# Patient Record
Sex: Female | Born: 1965
Health system: Southern US, Community
[De-identification: ages and names within clinical notes are randomized; demographics above are authoritative.]

## PROBLEM LIST (undated history)

## (undated) DIAGNOSIS — N943 Premenstrual tension syndrome: Secondary | ICD-10-CM

## (undated) DIAGNOSIS — K219 Gastro-esophageal reflux disease without esophagitis: Secondary | ICD-10-CM

## (undated) HISTORY — DX: Premenstrual tension syndrome: N94.3

## (undated) HISTORY — PX: TONSILLECTOMY AND ADENOIDECTOMY: SHX28

## (undated) HISTORY — DX: Gastro-esophageal reflux disease without esophagitis: K21.9

---

## 1998-03-24 ENCOUNTER — Other Ambulatory Visit: Admission: RE | Admit: 1998-03-24 | Discharge: 1998-03-24 | Payer: Self-pay | Admitting: Obstetrics and Gynecology

## 1998-12-16 ENCOUNTER — Other Ambulatory Visit: Admission: RE | Admit: 1998-12-16 | Discharge: 1998-12-16 | Payer: Self-pay | Admitting: Gynecology

## 1999-01-06 ENCOUNTER — Encounter: Admission: RE | Admit: 1999-01-06 | Discharge: 1999-04-06 | Payer: Self-pay | Admitting: Gynecology

## 1999-06-23 ENCOUNTER — Inpatient Hospital Stay (HOSPITAL_COMMUNITY): Admission: AD | Admit: 1999-06-23 | Discharge: 1999-06-25 | Payer: Self-pay | Admitting: Gynecology

## 1999-08-04 ENCOUNTER — Other Ambulatory Visit: Admission: RE | Admit: 1999-08-04 | Discharge: 1999-08-04 | Payer: Self-pay | Admitting: Gynecology

## 2000-08-28 ENCOUNTER — Other Ambulatory Visit: Admission: RE | Admit: 2000-08-28 | Discharge: 2000-08-28 | Payer: Self-pay | Admitting: Gynecology

## 2001-05-16 ENCOUNTER — Encounter: Payer: Self-pay | Admitting: Family Medicine

## 2001-05-16 ENCOUNTER — Ambulatory Visit (HOSPITAL_COMMUNITY): Admission: RE | Admit: 2001-05-16 | Discharge: 2001-05-16 | Payer: Self-pay | Admitting: Family Medicine

## 2001-09-12 ENCOUNTER — Other Ambulatory Visit: Admission: RE | Admit: 2001-09-12 | Discharge: 2001-09-12 | Payer: Self-pay | Admitting: Gynecology

## 2002-09-04 HISTORY — PX: GASTROSTOMY TUBE PLACEMENT: SHX655

## 2002-09-12 ENCOUNTER — Inpatient Hospital Stay (HOSPITAL_COMMUNITY): Admission: EM | Admit: 2002-09-12 | Discharge: 2002-09-14 | Payer: Self-pay | Admitting: Emergency Medicine

## 2002-09-12 ENCOUNTER — Encounter (INDEPENDENT_AMBULATORY_CARE_PROVIDER_SITE_OTHER): Payer: Self-pay | Admitting: Specialist

## 2002-09-12 ENCOUNTER — Encounter: Payer: Self-pay | Admitting: Emergency Medicine

## 2002-09-12 ENCOUNTER — Encounter: Payer: Self-pay | Admitting: General Surgery

## 2002-09-13 ENCOUNTER — Encounter (INDEPENDENT_AMBULATORY_CARE_PROVIDER_SITE_OTHER): Payer: Self-pay | Admitting: Specialist

## 2002-09-13 ENCOUNTER — Encounter: Payer: Self-pay | Admitting: General Surgery

## 2002-09-25 ENCOUNTER — Other Ambulatory Visit: Admission: RE | Admit: 2002-09-25 | Discharge: 2002-09-25 | Payer: Self-pay | Admitting: Gynecology

## 2003-09-26 ENCOUNTER — Other Ambulatory Visit: Admission: RE | Admit: 2003-09-26 | Discharge: 2003-09-26 | Payer: Self-pay | Admitting: Gynecology

## 2004-12-13 ENCOUNTER — Other Ambulatory Visit: Admission: RE | Admit: 2004-12-13 | Discharge: 2004-12-13 | Payer: Self-pay | Admitting: Gynecology

## 2006-01-09 ENCOUNTER — Other Ambulatory Visit: Admission: RE | Admit: 2006-01-09 | Discharge: 2006-01-09 | Payer: Self-pay | Admitting: Gynecology

## 2007-02-16 ENCOUNTER — Other Ambulatory Visit: Admission: RE | Admit: 2007-02-16 | Discharge: 2007-02-16 | Payer: Self-pay | Admitting: Gynecology

## 2008-03-20 ENCOUNTER — Encounter: Payer: Self-pay | Admitting: Women's Health

## 2008-03-20 ENCOUNTER — Other Ambulatory Visit: Admission: RE | Admit: 2008-03-20 | Discharge: 2008-03-20 | Payer: Self-pay | Admitting: Gynecology

## 2008-03-20 ENCOUNTER — Ambulatory Visit: Payer: Self-pay | Admitting: Women's Health

## 2009-04-01 ENCOUNTER — Ambulatory Visit: Payer: Self-pay | Admitting: Women's Health

## 2009-04-01 ENCOUNTER — Other Ambulatory Visit: Admission: RE | Admit: 2009-04-01 | Discharge: 2009-04-01 | Payer: Self-pay | Admitting: Gynecology

## 2010-05-21 NOTE — Op Note (Signed)
NAME:  Rebekah Aguilar, Rebekah Aguilar                         ACCOUNT NO.:  [id number]   MEDICAL RECORD NO.:  [id number]                   PATIENT TYPE:  INP   LOCATION:  5727                                 FACILITY:  [facility]   PHYSICIAN:  Gabrielle Dare. Janee Morn, M.D.             DATE OF BIRTH:  [birth year]   DATE OF PROCEDURE:  09/12/2002  DATE OF DISCHARGE:                                 OPERATIVE REPORT   PREOPERATIVE DIAGNOSIS:  Choledocholithiasis.   POSTOPERATIVE DIAGNOSIS:  Choledocholithiasis.   OPERATION PERFORMED:  Laparoscopic cholecystectomy with intraoperative  cholangiogram.   SURGEON:  Gabrielle Dare. Janee Morn, M.D.   ASSISTANT:  Magnus Ivan, RN   ANESTHESIA:  General.   INDICATIONS FOR PROCEDURE:  The patient is a 45 year old white female who  was admitted with acute onset of epigastric and right upper quadrant pain,  starting last night.  Evaluation in the emergency department demonstrated  elevated liver function tests and a borderline dilated common bile duct.  In  light of the fact her liver function tests were not extremely high and her  amylase and lipase were normal, she is brought to the operating room for  cholecystectomy with intraoperative cholangiogram with plans for  postoperative ERCP if we find a stone on her cholangiogram.   DESCRIPTION OF PROCEDURE:  Informed consent was obtained.  The patient  received intravenous antibiotics.  She was taken to the operating room.  General anesthesia was administered.  Her abdomen was prepped and draped in  sterile fashion.  A curvilinear infraumbilical incision was made.  Subcutaneous tissues were dissected down revealing the anterior fascia which  was divided sharply.  The peritoneal cavity was then entered under direct  vision without difficulty.  A pursestring 0 Vicryl suture was placed around  the fascia and the Hasson trocar was inserted into the abdomen. The abdomen  was insufflated with carbon dioxide in the standard  fashion.  Subsequently  under direct vision, an 11 mm epigastric port and two 5 mm lateral ports  were placed.  Once this was done, the dome of the gallbladder was retracted  superomedially.  The infundibulum was retracted inferolaterally.  Dissection  was begun laterally and progressed medially revealing the cystic duct and  cystic artery.  A large window was made around the cystic duct between the  infundibulum and the cystic duct and liver.  Once this was accomplished, we  had excellent visualization and a clip was placed on the infundibular cystic  duct junction.  A small nick was made in the cystic duct.  Some dark bile  came out and the cystic duct was milked back.  No stones came out.  A  Reddick cholangiogram catheter was inserted into the cystic duct and an  intraoperative cholangiogram was shot.  This demonstrated that there was a  filling defect in the common bile duct close to the ampulla with no emptying  of contrast into the  duodenum. There was a decent sized stump of cystic duct  remaining and the common bile duct was minimally dilated.  The cholangiogram  was completed.  The cholangiogram catheter was removed.  Three clips were  placed proximally on the cystic duct stump and then it was divided.  Subsequently, the cystic artery was dissected circumferentially.  Two clips  were placed on it proximally, one distally and it was divided as well.  The  gallbladder was then removed from the liver bed with the Bovie cautery.  Several areas on the liver were cauterized to get excellent hemostasis.  The  gallbladder was placed into an EndoCatch bag and removed from the abdomen  via the umbilical port site.  Once this was accomplished, the abdomen was  copiously irrigated.  The liver bed was rechecked for hemostasis and two  small areas were Bovied.  Good hemostasis was obtained.  The abdomen was  copiously irrigated again.  The irrigant returned clear.  The liver bed was  rechecked  and clips were noted to be in good position and there was no  bleeding.  Subsequently, the ports were removed under direct vision.  The  pneumoperitoneum was released.  The Hasson trocar was removed from the  umbilical port site.  The umbilical fascia was closed by tying the 0 Vicryl  pursestring suture with care not to entrap any intra-abdominal contents.  All four wounds were copiously irrigated.  0.25% Marcaine with epinephrine  had been used for local anesthetic at each port site and they were each  closed with a running 4-0 Vicryl subcuticular stitch.  Sponge, needle and  instrument counts were correct.  Benzoin and Steri-Strips, and sterile  dressings were applied.  The patient tolerated the procedure well without  complication and was taken to the recovery room in stable condition.  We  plan to consult GI in order to obtain a postoperative ERCP.                                                 Gabrielle Dare Janee Morn, M.D.    BET/MEDQ  D:  09/12/2002  T:  09/13/2002  Job:  [id number]

## 2010-05-21 NOTE — H&P (Signed)
NAME:  Rebekah Aguilar, Rebekah Aguilar                         ACCOUNT NO.:  [id number]   MEDICAL RECORD NO.:  [id number]                   PATIENT TYPE:  INP   LOCATION:  5727                                 FACILITY:  [facility]   PHYSICIAN:  Gabrielle Dare. Janee Morn, M.D.             DATE OF BIRTH:  [birth year]   DATE OF ADMISSION:  09/12/2002  DATE OF DISCHARGE:                                HISTORY & PHYSICAL   CHIEF COMPLAINT:  Epigastric abdominal pain.   HISTORY OF PRESENT ILLNESS:  The patient is a 45 year old white female who  has had some intermittent epigastric pain over the past several weeks. She  developed it again acutely at 11 p.m. last night. There was some associated  nausea but no vomiting. Pain was persistent through to this morning, and she  came to the emergency department after doing some research on the web after  which she was concerned about her gallbladder. Currently, the patient has a  little bit less pain. Workup in the emergency room included an ultrasound  showing gallstones and laboratory studies showing elevated LFTs. The patient  has no other complaints.   PAST MEDICAL HISTORY:  Seasonal allergies and reflux disease.   PAST SURGICAL HISTORY:  Foot surgery and tonsillectomy.   SOCIAL HISTORY:  She does not smoke or drink alcohol.   MEDICATIONS:  1. Prilosec one p.o. q.d.  2. Claritin one p.o. q.d.   ALLERGIES:  No known drug allergies.   FAMILY HISTORY:  Her mother has gallbladder disease and had a  cholecystectomy.   REVIEW OF SYSTEMS:  GENERAL:  Negative. CARDIOVASCULAR:  Negative.  PULMONARY:  Negative. GASTROINTESTINAL:  Please see the history of present  illness. No change in bowel movements. MUSCULOSKELETAL:  Negative except for  previous foot surgery. The remainder of the review of systems is negative.   PHYSICAL EXAMINATION:  VITAL SIGNS:  Blood pressure is 112/56, respirations  18, heart rate 83, temperature 97.4, saturations 98% on room air.  GENERAL:   The patient is awake, alert, and well appearing.  HEENT:  Pupils are equal and reactive. Sclerae have no significant icterus.  NECK:  Supple.  LUNGS:  Clear to auscultation bilaterally.  HEART:  Regular rate and rhythm. Radial pulses 2+ and equal bilaterally.  ABDOMEN:  Soft with some minimal tenderness to deep palpation in her medial  right upper quadrant and epigastrium. No other tenderness or masses are  palpated.  SKIN:  Warm and dry. No rashes.   LABORATORY DATA:  Amylase 34, lipase 26. Sodium 139, potassium 3.8, chloride  105, CO2 28, BUN 10, creatinine 0.8, glucose 126. AST 354, ALT 274, alkaline  phosphatase 121, bilirubin 1.8. White blood cell count 8.1, hemoglobin 13.6,  platelets 401. Abdominal ultrasound shows cholelithiasis with sludge in  addition to common bile duct of 7 mm which is the upper limit of normal.   IMPRESSION:  Choledocholithiasis. The patient has likely passed  a stone.   PLAN:  In light of the patient's normal amylase and lipase and only mildly  elevated LFTs, we will proceed with cholecystectomy today to prevent further  stones going into her common bile duct. We will do a cholangiogram at the  same time, and if she still has a stone present in her common bile duct, we  will have her evaluated by GI for ERCP. The procedure, which is laparoscopic-  possible open-cholecystectomy with cholangiogram, was discussed in detail  with the patient and her husband. Any questions were answered, and she is  agreeable with proceeding. We will keep her NPO and give her antibiotics in  preparation for going to the operating room.                                                Gabrielle Dare Janee Morn, M.D.    BET/MEDQ  D:  09/12/2002  T:  09/13/2002  Job:  [id number]

## 2010-05-21 NOTE — Discharge Summary (Signed)
   NAME:  Rebekah Aguilar, Rebekah Aguilar                         ACCOUNT NO.:  [id number]   MEDICAL RECORD NO.:  [id number]                   PATIENT TYPE:  INP   LOCATION:  5735                                 FACILITY:  [facility]   PHYSICIAN:  Gabrielle Dare. Janee Morn, M.D.             DATE OF BIRTH:  October 22, 1965   DATE OF ADMISSION:  09/12/2002  DATE OF DISCHARGE:  09/14/2002                                 DISCHARGE SUMMARY   DISCHARGE DIAGNOSES:  1. Choledocholithiasis.  2. Status post laparoscopic cholecystectomy with intraoperative     cholangiogram.  3. Status post endoscopic retrograde cholangiopancreatography with     sphincterotomy and stone removal.   HISTORY OF PRESENT ILLNESS:  The patient is a 45 year old white female with  recent attacks of epigastric pain who developed epigastric and right upper  quadrant pain the night prior to admission.  The pain was persistent.  She  came and was evaluated at the emergency department.  Workup revealed  gallstones in her gallbladder, common bile duct of 7 mm, and mildly elevated  LFT's.  Amylase and lipase were within normal limits.  I admitted the  patient with the diagnosis of choledocholithiasis.   HOSPITAL COURSE:  The patient was taken to the operating room for  cholecystectomy with cholangiogram.  She underwent laparoscopic  cholecystectomy, and on the intraoperative cholangiogram she was noted to  have a stone in her distal common bile duct.  She tolerated the procedure  well.  Postoperatively, Dr. Claudette Head from gastroenterology was asked to  consult on the patient.  He proceeded with an ERCP with sphincterotomy and  stone removal.  The patient tolerated that procedure well, and continued to  remain afebrile and hemodynamically stable, and the following day she was  discharged home.   DISCHARGE DIET:  Low fat.   DISCHARGE ACTIVITY:  No lifting.   DISCHARGE MEDICATIONS:  Percocet 5/325 one to two p.o. q.6h. p.r.n. pain.   FOLLOWUP:  With  myself in three weeks.                                                Gabrielle Dare Janee Morn, M.D.    BET/MEDQ  D:  09/27/2002  T:  09/29/2002  Job:  [id number]   cc:   Venita Lick. Russella Dar, M.D. [facility]

## 2010-05-21 NOTE — Discharge Summary (Signed)
[facility]  Patient:    ARYEL, EDELEN                      MRN: [id number] Adm. Date:  [id number] Disc. Date: [id number] Attending:  Tonye Royalty Dictator:   Antony Contras, RNC, [facility]                           Discharge Summary  DISCHARGE DIAGNOSES:          1. Intrauterine pregnancy at 40-3/7 weeks.                               2. Spontaneous rupture of membranes.                               3. Active labor.  PROCEDURE:                    Normal spontaneous vaginal delivery with aid of Tendertouch vacuum of viable infant over a midline episiotomy with no extensions.  HISTORY OF PRESENT ILLNESS:   The patient is a 45 year old, gravida 4, para 1-0-2-1, with LMP September 14, 1998, [facility] June 20, 1999.  Prenatal risk factors  included advanced maternal age with a normal amniocentesis.  PRENATAL LABORATORY DATA:     Blood type O positive, antibody screen negative.  RPR, HBSAG, and HIV nonreactive.  MSAFP within normal limits.  GBS negative.  HOSPITAL COURSE:              The patient presented on June 23, 1999, with spontaneous rupture of membranes, clear fluid, and was also at complete dilatation. Second stage progressed without difficulty and she was delivered per normal spontaneous vaginal delivery with aid of Tendertouch vacuum secondary to deep variable decellerations of a viable female infant Apgars 9 and 9, weight 8 pounds 5 ounces.  Cord pH was 7.30 over a midline episiotomy with no extensions. Postpartum course was uncomplicated.  Postpartum CBC; hematocrit 31.5, hemoglobin 10.8, WBC 14.4, and platelets 286.  She was able to be discharged on her second postpartum day.  She did receive some breastfeeding assistance prior to discharge.  DISPOSITION:                  Follow up in six weeks.  Continue prenatal vitamins and iron.  Motrin and Tylox for pain. DD:  07/16/99 TD:  07/16/99 Job: [id number]

## 2010-06-17 ENCOUNTER — Other Ambulatory Visit (HOSPITAL_COMMUNITY)
Admission: RE | Admit: 2010-06-17 | Discharge: 2010-06-17 | Disposition: A | Discharge status: Home / Self Care | Payer: 59 | Source: Ambulatory Visit | Attending: Gynecology | Admitting: Gynecology

## 2010-06-17 ENCOUNTER — Other Ambulatory Visit: Payer: Self-pay | Admitting: Women's Health

## 2010-06-17 ENCOUNTER — Encounter (INDEPENDENT_AMBULATORY_CARE_PROVIDER_SITE_OTHER): Payer: 59 | Admitting: Women's Health

## 2010-06-17 DIAGNOSIS — Z833 Family history of diabetes mellitus: Secondary | ICD-10-CM

## 2010-06-17 DIAGNOSIS — Z01419 Encounter for gynecological examination (general) (routine) without abnormal findings: Secondary | ICD-10-CM

## 2010-06-17 DIAGNOSIS — R82998 Other abnormal findings in urine: Secondary | ICD-10-CM

## 2010-06-17 DIAGNOSIS — Z124 Encounter for screening for malignant neoplasm of cervix: Secondary | ICD-10-CM | POA: Insufficient documentation

## 2011-06-28 ENCOUNTER — Other Ambulatory Visit: Payer: Self-pay | Admitting: *Deleted

## 2011-06-28 MED ORDER — FLUOXETINE HCL 20 MG PO CAPS: 20.0000 mg | ORAL_CAPSULE | Freq: Every day | ORAL | Status: DC

## 2011-07-29 ENCOUNTER — Encounter: Payer: Self-pay | Admitting: Women's Health

## 2011-07-29 ENCOUNTER — Ambulatory Visit (INDEPENDENT_AMBULATORY_CARE_PROVIDER_SITE_OTHER): Payer: 59 | Admitting: Women's Health

## 2011-07-29 ENCOUNTER — Other Ambulatory Visit: Payer: Self-pay | Admitting: Women's Health

## 2011-07-29 VITALS — BP 110/78 | Ht 67.25 in | Wt 202.0 lb

## 2011-07-29 DIAGNOSIS — Z01419 Encounter for gynecological examination (general) (routine) without abnormal findings: Secondary | ICD-10-CM

## 2011-07-29 DIAGNOSIS — F419 Anxiety disorder, unspecified: Secondary | ICD-10-CM | POA: Insufficient documentation

## 2011-07-29 DIAGNOSIS — Z833 Family history of diabetes mellitus: Secondary | ICD-10-CM

## 2011-07-29 DIAGNOSIS — F411 Generalized anxiety disorder: Secondary | ICD-10-CM

## 2011-07-29 DIAGNOSIS — Z13220 Encounter for screening for lipoid disorders: Secondary | ICD-10-CM

## 2011-07-29 LAB — GLUCOSE, RANDOM: Glucose, Bld: 87 mg/dL (ref 70–99)

## 2011-07-29 LAB — CBC WITH DIFFERENTIAL/PLATELET
Eosinophils Relative: 3 % (ref 0–5)
HCT: 39.9 % (ref 36.0–46.0)
Hemoglobin: 13.6 g/dL (ref 12.0–15.0)
Lymphocytes Relative: 29 % (ref 12–46)
Lymphs Abs: 1.8 10*3/uL (ref 0.7–4.0)
MCHC: 34.1 g/dL (ref 30.0–36.0)
MCV: 87.3 fL (ref 78.0–100.0)
Monocytes Absolute: 0.6 10*3/uL (ref 0.1–1.0)
Monocytes Relative: 10 % (ref 3–12)
Neutrophils Relative %: 57 % (ref 43–77)
RBC: 4.57 MIL/uL (ref 3.87–5.11)
WBC: 6.2 10*3/uL (ref 4.0–10.5)

## 2011-07-29 LAB — LIPID PANEL
HDL: 51 mg/dL (ref >39)
LDL Cholesterol: 159 mg/dL — ABNORMAL HIGH (ref 0–99)
VLDL: 28 mg/dL (ref 0–40)

## 2011-07-29 LAB — FOLLICLE STIMULATING HORMONE: FSH: 48 m[IU]/mL

## 2011-07-29 MED ORDER — FLUOXETINE HCL 20 MG PO CAPS: 20.0000 mg | ORAL_CAPSULE | Freq: Every day | ORAL | Status: DC

## 2011-07-29 NOTE — Progress Notes (Signed)
Rebekah Aguilar [birth year] [id number]    History:    The patient presents for annual exam.  Cycles mostly every 2 months for 4-5 days/condoms. History of all normal Paps and mammograms. On Prozac 20 mg daily for anxiety with good relief.   Past medical history, past surgical history, family history and social history were all reviewed and documented in the EPIC chart. Works at [employer] in [employer], [employer]. Daughter Rebekah Aguilar 12 doing well.   ROS:  A  ROS was performed and pertinent positives and negatives are included in the history.  Exam:  Filed Vitals:   07/29/11 0901  BP: 110/78    General appearance:  Normal Head/Neck:  Normal, without cervical or supraclavicular adenopathy. Thyroid:  Symmetrical, normal in size, without palpable masses or nodularity. Respiratory  Effort:  Normal  Auscultation:  Clear without wheezing or rhonchi Cardiovascular  Auscultation:  Regular rate, without rubs, murmurs or gallops  Edema/varicosities:  Not grossly evident Abdominal  Soft,nontender, without masses, guarding or rebound.  Liver/spleen:  No organomegaly noted  Hernia:  None appreciated  Skin  Inspection:  Grossly normal  Palpation:  Grossly normal Neurologic/psychiatric  Orientation:  Normal with appropriate conversation.  Mood/affect:  Normal  Genitourinary    Breasts: Examined lying and sitting.     Right: Without masses, retractions, discharge or axillary adenopathy.     Left: Without masses, retractions, discharge or axillary adenopathy.   Inguinal/mons:  Normal without inguinal adenopathy  External genitalia:  Normal  BUS/Urethra/Skene's glands:  Normal  Bladder:  Normal  Vagina:  Normal  Cervix:  Normal  Uterus:  normal in size, shape and contour.  Midline and mobile  Adnexa/parametria:     Rt: Without masses or tenderness.   Lt: Without masses or tenderness.  Anus and perineum: Normal  Digital rectal exam: Normal sphincter tone without palpated masses or  tenderness  Assessment/Plan:  46 y.o.  M. WF G4 P2 (one child adopted) for annual exam with no complaints.  Normal GYN exam Anxiety stable on Prozac 20 mg daily  Plan: Contraception options reviewed, declines will continue with condoms. Instructed to call if cycles space greater than 3 months. SBE's, continue annual mammogram, calcium rich diet, increase exercise and decrease calories for weight loss, vitamin D 1000 daily encouraged. Prozac 20 mg by mouth daily prescription, proper use given and reviewed declines counseling needs at this time. CBC, glucose, lipid panel, UA. No Pap history of all normal Paps, new screening guidelines reviewed.    Harrington Challenger [facility], 9:57 AM 07/29/2011

## 2011-07-29 NOTE — Patient Instructions (Signed)

## 2011-07-30 LAB — URINALYSIS W MICROSCOPIC + REFLEX CULTURE
Crystals: NONE SEEN
Glucose, UA: NEGATIVE mg/dL
Leukocytes, UA: NEGATIVE
Specific Gravity, Urine: 1.024 (ref 1.005–1.030)

## 2011-08-01 ENCOUNTER — Other Ambulatory Visit: Payer: Self-pay | Admitting: Women's Health

## 2011-08-01 DIAGNOSIS — E7800 Pure hypercholesterolemia, unspecified: Secondary | ICD-10-CM

## 2011-08-05 ENCOUNTER — Encounter: Payer: Self-pay | Admitting: Women's Health

## 2012-07-30 ENCOUNTER — Ambulatory Visit (INDEPENDENT_AMBULATORY_CARE_PROVIDER_SITE_OTHER): Payer: 59 | Admitting: Women's Health

## 2012-07-30 ENCOUNTER — Encounter: Payer: Self-pay | Admitting: Women's Health

## 2012-07-30 ENCOUNTER — Other Ambulatory Visit (HOSPITAL_COMMUNITY)
Admission: RE | Admit: 2012-07-30 | Discharge: 2012-07-30 | Disposition: A | Discharge status: Home / Self Care | Payer: 59 | Source: Ambulatory Visit | Attending: Gynecology | Admitting: Gynecology

## 2012-07-30 VITALS — BP 116/70 | Ht 67.0 in | Wt 198.0 lb

## 2012-07-30 DIAGNOSIS — F419 Anxiety disorder, unspecified: Secondary | ICD-10-CM

## 2012-07-30 DIAGNOSIS — F411 Generalized anxiety disorder: Secondary | ICD-10-CM

## 2012-07-30 DIAGNOSIS — Z13220 Encounter for screening for lipoid disorders: Secondary | ICD-10-CM

## 2012-07-30 DIAGNOSIS — Z01419 Encounter for gynecological examination (general) (routine) without abnormal findings: Secondary | ICD-10-CM | POA: Insufficient documentation

## 2012-07-30 DIAGNOSIS — Z833 Family history of diabetes mellitus: Secondary | ICD-10-CM

## 2012-07-30 LAB — LIPID PANEL
Cholesterol: 222 mg/dL — ABNORMAL HIGH (ref 0–200)
HDL: 50 mg/dL (ref >39)
LDL Cholesterol: 148 mg/dL — ABNORMAL HIGH (ref 0–99)
Total CHOL/HDL Ratio: 4.4 Ratio
Triglycerides: 119 mg/dL (ref <150)
VLDL: 24 mg/dL (ref 0–40)

## 2012-07-30 LAB — CBC WITH DIFFERENTIAL/PLATELET
Basophils Absolute: 0 10*3/uL (ref 0.0–0.1)
Basophils Relative: 1 % (ref 0–1)
Eosinophils Absolute: 0.1 10*3/uL (ref 0.0–0.7)
Eosinophils Relative: 2 % (ref 0–5)
HCT: 38.6 % (ref 36.0–46.0)
Hemoglobin: 13 g/dL (ref 12.0–15.0)
Lymphocytes Relative: 32 % (ref 12–46)
Lymphs Abs: 1.9 10*3/uL (ref 0.7–4.0)
MCH: 29.7 pg (ref 26.0–34.0)
MCHC: 33.7 g/dL (ref 30.0–36.0)
MCV: 88.1 fL (ref 78.0–100.0)
Monocytes Absolute: 0.6 10*3/uL (ref 0.1–1.0)
Monocytes Relative: 10 % (ref 3–12)
Neutro Abs: 3.1 10*3/uL (ref 1.7–7.7)
Neutrophils Relative %: 55 % (ref 43–77)
Platelets: 323 10*3/uL (ref 150–400)
RBC: 4.38 MIL/uL (ref 3.87–5.11)
RDW: 13.6 % (ref 11.5–15.5)
WBC: 5.8 10*3/uL (ref 4.0–10.5)

## 2012-07-30 LAB — GLUCOSE, RANDOM: Glucose, Bld: 83 mg/dL (ref 70–99)

## 2012-07-30 MED ORDER — FLUOXETINE HCL 20 MG PO CAPS: 20.0000 mg | ORAL_CAPSULE | Freq: Every day | ORAL | Status: DC

## 2012-07-30 NOTE — Progress Notes (Signed)
Virjean Boman Leblond [birth year] [id number]    History:    The patient presents for annual exam.  Monthly cycle/condoms. Normal Pap and mammogram history. Prozac 20 for anxiety with good relief. Lipid panel elevated last year.   Past medical history, past surgical history, family history and social history were all reviewed and documented in the EPIC chart. 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 Mother died 05-17-09 from lung cancer, also had hypertension and diabetes. Father's medical history unknown.   ROS:  A  ROS was performed and pertinent positives and negatives are included in the history.  Exam:  Filed Vitals:   07/30/12 0912  BP: 116/70    General appearance:  Normal Head/Neck:  Normal, without cervical or supraclavicular adenopathy. Thyroid:  Symmetrical, normal in size, without palpable masses or nodularity. Respiratory  Effort:  Normal  Auscultation:  Clear without wheezing or rhonchi Cardiovascular  Auscultation:  Regular rate, without rubs, murmurs or gallops  Edema/varicosities:  Not grossly evident Abdominal  Soft,nontender, without masses, guarding or rebound.  Liver/spleen:  No organomegaly noted  Hernia:  None appreciated  Skin  Inspection:  Grossly normal  Palpation:  Grossly normal Neurologic/psychiatric  Orientation:  Normal with appropriate conversation.  Mood/affect:  Normal  Genitourinary    Breasts: Examined lying and sitting.     Right: Without masses, retractions, discharge or axillary adenopathy.     Left: Without masses, retractions, discharge or axillary adenopathy.   Inguinal/mons:  Normal without inguinal adenopathy  External genitalia:  Normal  BUS/Urethra/Skene's glands:  Normal  Bladder:  Normal  Vagina:  Normal  Cervix:  Normal  Uterus:   normal in size, shape and contour.  Midline and mobile  Adnexa/parametria:     Rt: Without masses or tenderness.   Lt: Without masses or tenderness.  Anus and perineum: Normal  Digital rectal  exam: Normal sphincter tone without palpated masses or tenderness  Assessment/Plan:  47 y.o. MWF G4 P2 (1 adopted at birth) for annual exam with no complaints.  Anxiety, stable on Prozac 20 Normal GYN exam/condoms     Plan: SBE's, continue annual mammogram, calcium rich diet, vitamin D 1000 daily encouraged. Reviewed importance of continuing and increasing regular exercise, decreasing calories for weight loss and health. Prozac 20 mg by mouth daily, prescription, proper use given, declines need for counseling at this time. Contraception options reviewed and declined will continue condoms. CBC, glucose, lipid panel, UA, Pap. Pap normal 18-May-2010, new screening guidelines reviewed.    Harrington Challenger [facility]NP, 10:00 AM 07/30/2012

## 2012-07-30 NOTE — Patient Instructions (Addendum)

## 2012-07-31 ENCOUNTER — Encounter: Payer: Self-pay | Admitting: Women's Health

## 2012-07-31 LAB — URINALYSIS W MICROSCOPIC + REFLEX CULTURE
Bacteria, UA: NONE SEEN
Bilirubin Urine: NEGATIVE
Casts: NONE SEEN
Crystals: NONE SEEN
Ketones, ur: NEGATIVE mg/dL
Specific Gravity, Urine: 1.017 (ref 1.005–1.030)
Urobilinogen, UA: 1 mg/dL (ref 0.0–1.0)
pH: 6 (ref 5.0–8.0)

## 2012-11-08 ENCOUNTER — Other Ambulatory Visit: Payer: Self-pay

## 2013-09-12 ENCOUNTER — Encounter: Payer: Self-pay | Admitting: Women's Health

## 2013-09-12 ENCOUNTER — Other Ambulatory Visit: Payer: Self-pay | Admitting: Women's Health

## 2013-09-12 ENCOUNTER — Ambulatory Visit (INDEPENDENT_AMBULATORY_CARE_PROVIDER_SITE_OTHER): Payer: 59 | Admitting: Women's Health

## 2013-09-12 VITALS — BP 112/72 | Ht 67.0 in | Wt 208.4 lb

## 2013-09-12 DIAGNOSIS — Z01419 Encounter for gynecological examination (general) (routine) without abnormal findings: Secondary | ICD-10-CM

## 2013-09-12 DIAGNOSIS — F411 Generalized anxiety disorder: Secondary | ICD-10-CM

## 2013-09-12 DIAGNOSIS — N951 Menopausal and female climacteric states: Secondary | ICD-10-CM

## 2013-09-12 DIAGNOSIS — Z13220 Encounter for screening for lipoid disorders: Secondary | ICD-10-CM

## 2013-09-12 DIAGNOSIS — Z780 Asymptomatic menopausal state: Secondary | ICD-10-CM

## 2013-09-12 DIAGNOSIS — F419 Anxiety disorder, unspecified: Secondary | ICD-10-CM

## 2013-09-12 LAB — URINALYSIS W MICROSCOPIC + REFLEX CULTURE
Bilirubin Urine: NEGATIVE
Casts: NONE SEEN
Crystals: NONE SEEN
GLUCOSE, UA: NEGATIVE mg/dL
Hgb urine dipstick: NEGATIVE
Ketones, ur: NEGATIVE mg/dL
NITRITE: NEGATIVE
PROTEIN: NEGATIVE mg/dL
RBC / HPF: NONE SEEN RBC/hpf (ref <3)
Specific Gravity, Urine: 1.01 (ref 1.005–1.030)
UROBILINOGEN UA: 1 mg/dL (ref 0.0–1.0)
pH: 7 (ref 5.0–8.0)

## 2013-09-12 LAB — CBC WITH DIFFERENTIAL/PLATELET
BASOS PCT: 0 % (ref 0–1)
Basophils Absolute: 0 10*3/uL (ref 0.0–0.1)
Eosinophils Absolute: 0.1 10*3/uL (ref 0.0–0.7)
Eosinophils Relative: 2 % (ref 0–5)
HEMATOCRIT: 38 % (ref 36.0–46.0)
HEMOGLOBIN: 12.8 g/dL (ref 12.0–15.0)
LYMPHS ABS: 1.9 10*3/uL (ref 0.7–4.0)
LYMPHS PCT: 28 % (ref 12–46)
MCH: 29.6 pg (ref 26.0–34.0)
MCHC: 33.7 g/dL (ref 30.0–36.0)
MCV: 88 fL (ref 78.0–100.0)
MONO ABS: 0.7 10*3/uL (ref 0.1–1.0)
MONOS PCT: 11 % (ref 3–12)
NEUTROS ABS: 4 10*3/uL (ref 1.7–7.7)
NEUTROS PCT: 59 % (ref 43–77)
Platelets: 338 10*3/uL (ref 150–400)
RBC: 4.32 MIL/uL (ref 3.87–5.11)
RDW: 13.2 % (ref 11.5–15.5)
WBC: 6.7 10*3/uL (ref 4.0–10.5)

## 2013-09-12 MED ORDER — FLUOXETINE HCL 20 MG PO CAPS: 20.0000 mg | ORAL_CAPSULE | Freq: Every day | ORAL | Status: DC

## 2013-09-12 NOTE — Progress Notes (Signed)
Rebekah Aguilar 1965-02-12 [id number]    History:    Presents for annual exam.  Amenorrheic x1 year minimal menopausal symptoms. Normal Pap and mammogram history. On Prozac 20 mg daily doing well.  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 Mother died of lung cancer also had diabetes and hypertension. Father's history unknown. Elevated cholesterol 2014, normal glucose.  ROS:  A  12 point ROS was performed and pertinent positives and negatives are included.  Exam:  Filed Vitals:   09/12/13 1530  BP: 112/72    General appearance:  Normal Thyroid:  Symmetrical, normal in size, without palpable masses or nodularity. Respiratory  Auscultation:  Clear without wheezing or rhonchi Cardiovascular  Auscultation:  Regular rate, without rubs, murmurs or gallops  Edema/varicosities:  Not grossly evident Abdominal  Soft,nontender, without masses, guarding or rebound.  Liver/spleen:  No organomegaly noted  Hernia:  None appreciated  Skin  Inspection:  Grossly normal   Breasts: Examined lying and sitting.     Right: Without masses, retractions, discharge or axillary adenopathy.     Left: Without masses, retractions, discharge or axillary adenopathy. Gentitourinary   Inguinal/mons:  Normal without inguinal adenopathy  External genitalia:  Normal  BUS/Urethra/Skene's glands:  Normal  Vagina:  Normal  Cervix:  Normal  Uterus:   normal in size, shape and contour.  Midline and mobile  Adnexa/parametria:     Rt: Without masses or tenderness.   Lt: Without masses or tenderness.  Anus and perineum: Normal  Digital rectal exam: Normal sphincter tone without palpated masses or tenderness  Assessment/Plan:  48 y.o. MWF G4P2 (1adopted at birth) for annual exam with no complaints.  Postmenopausal on no HRT with no bleeding Anxiety/depression stable on  Prozac Obesity  Plan: Prozac 20 mg prescription, proper use given and reviewed, declines need for further counseling. CBC, TSH, FSH, Vit D, comprehensive metabolic panel, lipid panel, UA Pap normal 2014, new screening guidelines reviewed. Reviewed importance of increasing exercise and decreasing calories for weight loss. SBE's, continue annual screening mammogram, calcium rich diet, vitamin D 2000 daily, screening colonoscopy at 50. Urine culture pending, asymptomatic, (UA moderate leukocytes, 21-50 WBCs, many bacteria, many epithelial's ).  Note: This dictation was prepared with Dragon/digital dictation.  Any transcriptional errors that result are unintentional. Rebekah Aguilar [facility], 4:39 PM 09/12/2013

## 2013-09-12 NOTE — Patient Instructions (Signed)
Health Recommendations for Postmenopausal Women Respected and ongoing research has looked at the most common causes of death, disability, and poor quality of life in postmenopausal women. The causes include heart disease, diseases of blood vessels, diabetes, depression, cancer, and bone loss (osteoporosis). Many things can be done to help lower the chances of developing these and other common problems. CARDIOVASCULAR DISEASE Heart Disease: A heart attack is a medical emergency. Know the signs and symptoms of a heart attack. Below are things women can do to reduce their risk for heart disease.   Do not smoke. If you smoke, quit.  Aim for a healthy weight. Being overweight causes many preventable deaths. Eat a healthy and balanced diet and drink an adequate amount of liquids.  Get moving. Make a commitment to be more physically active. Aim for 30 minutes of activity on most, if not all days of the week.  Eat for heart health. Choose a diet that is low in saturated fat and cholesterol and eliminate trans fat. Include whole grains, vegetables, and fruits. Read and understand the labels on food containers before buying.  Know your numbers. Ask your caregiver to check your blood pressure, cholesterol (total, HDL, LDL, triglycerides) and blood glucose. Work with your caregiver on improving your entire clinical picture.  High blood pressure. Limit or stop your table salt intake (try salt substitute and food seasonings). Avoid salty foods and drinks. Read labels on food containers before buying. Eating well and exercising can help control high blood pressure. STROKE  Stroke is a medical emergency. Stroke may be the result of a blood clot in a blood vessel in the brain or by a brain hemorrhage (bleeding). Know the signs and symptoms of a stroke. To lower the risk of developing a stroke:  Avoid fatty foods.  Quit smoking.  Control your diabetes, blood pressure, and irregular heart rate. THROMBOPHLEBITIS  (BLOOD CLOT) OF THE LEG  Becoming overweight and leading a stationary lifestyle may also contribute to developing blood clots. Controlling your diet and exercising will help lower the risk of developing blood clots. CANCER SCREENING  Breast Cancer: Take steps to reduce your risk of breast cancer.  You should practice "breast self-awareness." This means understanding the normal appearance and feel of your breasts and should include breast self-examination. Any changes detected, no matter how small, should be reported to your caregiver.  After age 40, you should have a clinical breast exam (CBE) every year.  Starting at age 40, you should consider having a mammogram (breast X-ray) every year.  If you have a family history of breast cancer, talk to your caregiver about genetic screening.  If you are at high risk for breast cancer, talk to your caregiver about having an MRI and a mammogram every year.  Intestinal or Stomach Cancer: Tests to consider are a rectal exam, fecal occult blood, sigmoidoscopy, and colonoscopy. Women who are high risk may need to be screened at an earlier age and more often.  Cervical Cancer:  Beginning at age 30, you should have a Pap test every 3 years as long as the past 3 Pap tests have been normal.  If you have had past treatment for cervical cancer or a condition that could lead to cancer, you need Pap tests and screening for cancer for at least 20 years after your treatment.  If you had a hysterectomy for a problem that was not cancer or a condition that could lead to cancer, then you no longer need Pap tests.    If you are between ages 65 and 70, and you have had normal Pap tests going back 10 years, you no longer need Pap tests.  If Pap tests have been discontinued, risk factors (such as a new sexual partner) need to be reassessed to determine if screening should be resumed.  Some medical problems can increase the chance of getting cervical cancer. In these  cases, your caregiver may recommend more frequent screening and Pap tests.  Uterine Cancer: If you have vaginal bleeding after reaching menopause, you should notify your caregiver.  Ovarian Cancer: Other than yearly pelvic exams, there are no reliable tests available to screen for ovarian cancer at this time except for yearly pelvic exams.  Lung Cancer: Yearly chest X-rays can detect lung cancer and should be done on high risk women, such as cigarette smokers and women with chronic lung disease (emphysema).  Skin Cancer: A complete body skin exam should be done at your yearly examination. Avoid overexposure to the sun and ultraviolet light lamps. Use a strong sun block cream when in the sun. All of these things are important for lowering the risk of skin cancer. MENOPAUSE Menopause Symptoms: Hormone therapy products are effective for treating symptoms associated with menopause:  Moderate to severe hot flashes.  Night sweats.  Mood swings.  Headaches.  Tiredness.  Loss of sex drive.  Insomnia.  Other symptoms. Hormone replacement carries certain risks, especially in older women. Women who use or are thinking about using estrogen or estrogen with progestin treatments should discuss that with their caregiver. Your caregiver will help you understand the benefits and risks. The ideal dose of hormone replacement therapy is not known. The Food and Drug Administration (FDA) has concluded that hormone therapy should be used only at the lowest doses and for the shortest amount of time to reach treatment goals.  OSTEOPOROSIS Protecting Against Bone Loss and Preventing Fracture If you use hormone therapy for prevention of bone loss (osteoporosis), the risks for bone loss must outweigh the risk of the therapy. Ask your caregiver about other medications known to be safe and effective for preventing bone loss and fractures. To guard against bone loss or fractures, the following is recommended:  If  you are younger than age 50, take 1000 mg of calcium and at least 600 mg of Vitamin D per day.  If you are older than age 50 but younger than age 70, take 1200 mg of calcium and at least 600 mg of Vitamin D per day.  If you are older than age 70, take 1200 mg of calcium and at least 800 mg of Vitamin D per day. Smoking and excessive alcohol intake increases the risk of osteoporosis. Eat foods rich in calcium and vitamin D and do weight bearing exercises several times a week as your caregiver suggests. DIABETES Diabetes Mellitus: If you have type I or type 2 diabetes, you should keep your blood sugar under control with diet, exercise, and recommended medication. Avoid starchy and fatty foods, and too many sweets. Being overweight can make diabetes control more difficult. COGNITION AND MEMORY Cognition and Memory: Menopausal hormone therapy is not recommended for the prevention of cognitive disorders such as Alzheimer's disease or memory loss.  DEPRESSION  Depression may occur at any age, but it is common in elderly women. This may be because of physical, medical, social (loneliness), or financial problems and needs. If you are experiencing depression because of medical problems and control of symptoms, talk to your caregiver about this. Physical   activity and exercise may help with mood and sleep. Community and volunteer involvement may improve your sense of value and worth. If you have depression and you feel that the problem is getting worse or becoming severe, talk to your caregiver about which treatment options are best for you. ACCIDENTS  Accidents are common and can be serious in elderly woman. Prepare your house to prevent accidents. Eliminate throw rugs, place hand bars in bath, shower, and toilet areas. Avoid wearing high heeled shoes or walking on wet, snowy, and icy areas. Limit or stop driving if you have vision or hearing problems, or if you feel you are unsteady with your movements and  reflexes. HEPATITIS C Hepatitis C is a type of viral infection affecting the liver. It is spread mainly through contact with blood from an infected person. It can be treated, but if left untreated, it can lead to severe liver damage over the years. Many people who are infected do not know that the virus is in their blood. If you are a "baby-boomer", it is recommended that you have one screening test for Hepatitis C. IMMUNIZATIONS  Several immunizations are important to consider having during your senior years, including:   Tetanus, diphtheria, and pertussis booster shot.  Influenza every year before the flu season begins.  Pneumonia vaccine.  Shingles vaccine.  Others, as indicated based on your specific needs. Talk to your caregiver about these. Document Released: 02/11/2005 Document Revised: 05/06/2013 Document Reviewed: 10/08/2007 ExitCare Patient Information 2015 [employer]. This information is not intended to replace advice given to you by your health care provider. Make sure you discuss any questions you have with your health care provider.  

## 2013-09-13 LAB — LIPID PANEL
Cholesterol: 215 mg/dL — ABNORMAL HIGH (ref 0–200)
HDL: 48 mg/dL (ref >39)
LDL Cholesterol: 126 mg/dL — ABNORMAL HIGH (ref 0–99)
Total CHOL/HDL Ratio: 4.5 Ratio
Triglycerides: 206 mg/dL — ABNORMAL HIGH (ref <150)
VLDL: 41 mg/dL — AB (ref 0–40)

## 2013-09-13 LAB — COMPREHENSIVE METABOLIC PANEL
ALT: 28 U/L (ref 0–35)
AST: 21 U/L (ref 0–37)
Albumin: 4.5 g/dL (ref 3.5–5.2)
Alkaline Phosphatase: 63 U/L (ref 39–117)
BUN: 11 mg/dL (ref 6–23)
CALCIUM: 9.6 mg/dL (ref 8.4–10.5)
CHLORIDE: 101 meq/L (ref 96–112)
CO2: 28 mEq/L (ref 19–32)
Creat: 0.84 mg/dL (ref 0.50–1.10)
Glucose, Bld: 88 mg/dL (ref 70–99)
Potassium: 4.3 mEq/L (ref 3.5–5.3)
Sodium: 138 mEq/L (ref 135–145)
Total Bilirubin: 0.5 mg/dL (ref 0.2–1.2)
Total Protein: 7.1 g/dL (ref 6.0–8.3)

## 2013-09-13 LAB — T3 UPTAKE: T3 UPTAKE: 25 % (ref 22.0–35.0)

## 2013-09-13 LAB — T4: T4, Total: 8 ug/dL (ref 4.5–12.0)

## 2013-09-13 LAB — VITAMIN D 25 HYDROXY (VIT D DEFICIENCY, FRACTURES): VIT D 25 HYDROXY: 44 ng/mL (ref 30–89)

## 2013-09-13 LAB — TSH: TSH: 5.585 u[IU]/mL — ABNORMAL HIGH (ref 0.350–4.500)

## 2013-09-13 LAB — FOLLICLE STIMULATING HORMONE: FSH: 44 m[IU]/mL

## 2013-09-14 LAB — URINE CULTURE

## 2013-09-14 LAB — THYROID ANTIBODIES
Thyroglobulin Ab: 26 IU/mL — ABNORMAL HIGH (ref <2)
Thyroperoxidase Ab SerPl-aCnc: 617 IU/mL — ABNORMAL HIGH (ref <9)

## 2013-09-16 ENCOUNTER — Encounter: Payer: Self-pay | Admitting: Women's Health

## 2013-09-17 ENCOUNTER — Encounter: Payer: Self-pay | Admitting: Women's Health

## 2013-09-20 ENCOUNTER — Telehealth: Payer: Self-pay | Admitting: *Deleted

## 2013-09-20 DIAGNOSIS — E039 Hypothyroidism, unspecified: Secondary | ICD-10-CM

## 2013-09-20 NOTE — Telephone Encounter (Signed)
Message copied by Aura Camps on Fri Sep 20, 2013  9:48 AM ------      Message from: Keenan Bachelor      Created: Thu Sep 19, 2013 12:07 PM      Regarding: Referral to Endocrinology       Per NTSH up, hypothyroid, T3 and T4 normal but pos for antibodies, so will need to see Dr Talmage Nap or endocrinologist avail.  Please refer "            Thanks!! ------

## 2013-09-20 NOTE — Telephone Encounter (Signed)
Referral placed for Dr.Gherige office they will contact pt to schedule.

## 2013-09-24 ENCOUNTER — Encounter: Payer: Self-pay | Admitting: Women's Health

## 2013-09-25 NOTE — Telephone Encounter (Signed)
Appointment 10/11/13 @ 9:15 am

## 2013-10-11 ENCOUNTER — Encounter: Payer: Self-pay | Admitting: Internal Medicine

## 2013-10-11 ENCOUNTER — Ambulatory Visit (INDEPENDENT_AMBULATORY_CARE_PROVIDER_SITE_OTHER): Payer: 59 | Admitting: Internal Medicine

## 2013-10-11 VITALS — BP 118/80 | HR 75 | Temp 98.2°F | Resp 12 | Ht 67.0 in | Wt 206.8 lb

## 2013-10-11 DIAGNOSIS — E038 Other specified hypothyroidism: Secondary | ICD-10-CM | POA: Insufficient documentation

## 2013-10-11 DIAGNOSIS — E063 Autoimmune thyroiditis: Secondary | ICD-10-CM

## 2013-10-11 LAB — T4, FREE: FREE T4: 0.79 ng/dL (ref 0.60–1.60)

## 2013-10-11 LAB — T3, FREE: T3, Free: 3 pg/mL (ref 2.3–4.2)

## 2013-10-11 LAB — TSH: TSH: 1 u[IU]/mL (ref 0.35–4.50)

## 2013-10-11 NOTE — Patient Instructions (Signed)
You have Hashimoto's thyroiditis. Please stop at the lab. Please come back for a follow-up appointment in 6 months. Let me know if you develop the following symptoms:  Hypothyroidism The thyroid is a large gland located in the lower front of your neck. The thyroid gland helps control metabolism. Metabolism is how your body handles food. It controls metabolism with the hormone thyroxine. When this gland is underactive (hypothyroid), it produces too little hormone.  CAUSES These include:   Absence or destruction of thyroid tissue.  Goiter due to iodine deficiency.  Goiter due to medications.  Congenital defects (since birth).  Problems with the pituitary. This causes a lack of TSH (thyroid stimulating hormone). This hormone tells the thyroid to turn out more hormone. SYMPTOMS  Lethargy (feeling as though you have no energy)  Cold intolerance  Weight gain (in spite of normal food intake)  Dry skin  Coarse hair  Menstrual irregularity (if severe, may lead to infertility)  Slowing of thought processes Cardiac problems are also caused by insufficient amounts of thyroid hormone. Hypothyroidism in the newborn is cretinism, and is an extreme form. It is important that this form be treated adequately and immediately or it will lead rapidly to retarded physical and mental development. DIAGNOSIS  To prove hypothyroidism, your caregiver may do blood tests and ultrasound tests. Sometimes the signs are hidden. It may be necessary for your caregiver to watch this illness with blood tests either before or after diagnosis and treatment. TREATMENT  Low levels of thyroid hormone are increased by using synthetic thyroid hormone. This is a safe, effective treatment. It usually takes about four weeks to gain the full effects of the medication. After you have the full effect of the medication, it will generally take another four weeks for problems to leave. Your caregiver may start you on low doses. If  you have had heart problems the dose may be gradually increased. It is generally not an emergency to get rapidly to normal. HOME CARE INSTRUCTIONS   Take your medications as your caregiver suggests. Let your caregiver know of any medications you are taking or start taking. Your caregiver will help you with dosage schedules.  As your condition improves, your dosage needs may increase. It will be necessary to have continuing blood tests as suggested by your caregiver.  Report all suspected medication side effects to your caregiver. SEEK MEDICAL CARE IF: Seek medical care if you develop:  Sweating.  Tremulousness (tremors).  Anxiety.  Rapid weight loss.  Heat intolerance.  Emotional swings.  Diarrhea.  Weakness. SEEK IMMEDIATE MEDICAL CARE IF:  You develop chest pain, an irregular heart beat (palpitations), or a rapid heart beat. MAKE SURE YOU:   Understand these instructions.  Will watch your condition.  Will get help right away if you are not doing well or get worse. Document Released: 12/20/2004 Document Revised: 03/14/2011 Document Reviewed: 08/10/2007 [facility] Patient Information 2015 [employer], [place]. This information is not intended to replace advice given to you by your health care provider. Make sure you discuss any questions you have with your health care provider.

## 2013-10-11 NOTE — Progress Notes (Signed)
Patient ID: Rebekah Aguilar, female   DOB: 06/12/1965, 48 y.o.   MRN: [id number]   HPI  Rebekah Aguilar is a [age] y.o.-year-old female, referred by Maryelizabeth RowanNancy Young, NP, for management of Hashimoto's hypothyroidism.  Pt. has been dx with hypothyroidism in 09/2013, when she had a high TSH.   I reviewed pt's thyroid tests: Lab Results  Component Value Date   TSH 5.585* 09/12/2013    Component     Latest Ref Rng 09/12/2013  T4, Total     4.5 - 12.0 ug/dL 8.0   Thyroid Ab's were high: Component     Latest Ref Rng 09/12/2013  Thyroid Peroxidase Antibody     <9 IU/mL 617 (H)  Thyroglobulin Ab     <2 IU/mL 26 (H)   She is on Ca, MVI, PPI in am.  Pt describes: - no fatigue - no weight gain - no cold intolerance - + constipation/+ diarrhea - no dry skin - no hair falling - no anxiety/no depression  Pt denies feeling nodules in neck, hoarseness, dysphagia/odynophagia, SOB with lying down.  She has no FH of thyroid disorders. No FH of thyroid cancer.  No h/o radiation tx to head or neck. No recent use of iodine supplements.  I reviewed her chart and she also has a history of GERD; joint pain.   ROS: Constitutional: see HPI, + excessive urination Eyes: no blurry vision, no xerophthalmia ENT: no sore throat, no nodules palpated in throat, no dysphagia/odynophagia, no hoarseness Cardiovascular: no CP/SOB/palpitations/leg swelling Respiratory: no cough/SOB Gastrointestinal: + all: N/D/C/acid reflux Musculoskeletal: + all: muscle/joint aches Skin: no rashes Neurological: no tremors/numbness/tingling/dizziness Psychiatric: no depression/anxiety  Past Medical History  Diagnosis Date  . PMS (premenstrual syndrome)   . Acid reflux    Past Surgical History  Procedure Laterality Date  . Tonsillectomy and adenoidectomy      CHILD AGE  . Gastrostomy tube placement  9/04   History   Social History  . Marital Status: Married    Spouse Name: N/A    Number of Children: 2    Occupational History  . accountant   Social History Main Topics  . Smoking status: Never Smoker   . Smokeless tobacco: Never Used  . Alcohol Use: Yes     Comment: occassionally  . Drug Use: No  . Sexual Activity: Yes    Partners: Male    Birth Control/ Protection: Condom    Social drinker: beer    Current Outpatient Prescriptions on File Prior to Visit  Medication Sig Dispense Refill  . FLUoxetine (PROZAC) 20 MG capsule Take 1 capsule (20 mg total) by mouth daily.  90 capsule  4  . Loratadine (CLARITIN PO) as needed. ALLERGIES       . Multiple Vitamins-Minerals (CENTRUM PO) daily.        . Omeprazole (PRILOSEC PO) as needed. REFLUX       . CALCIUM PO         No current facility-administered medications on file prior to visit.   No Known Allergies Family History  Problem Relation Age of Onset  . Hypertension Mother   . Lung cancer Mother     DIED IN 2/11  . Diabetes Mother   . Diabetes Maternal Grandmother    PE: BP 118/80  Pulse 75  Temp(Src) 98.2 F (36.8 C) (Oral)  Resp 12  Ht 5\' 7"  (1.702 m)  Wt 206 lb 12.8 oz (93.804 kg)  BMI 32.38 kg/m2  SpO2 96% Wt Readings from  Last 3 Encounters:  10/11/13 206 lb 12.8 oz (93.804 kg)  09/12/13 208 lb 6.4 oz (94.53 kg)  07/30/12 198 lb (89.812 kg)   Constitutional: overweight, in NAD Eyes: PERRLA, EOMI, no exophthalmos ENT: moist mucous membranes, + symmetric thyromegaly, no nodules palpated, no cervical lymphadenopathy Cardiovascular: RRR, No MRG Respiratory: CTA B Gastrointestinal: abdomen soft, NT, ND, BS+ Musculoskeletal: no deformities, strength intact in all 4 Skin: moist, warm, no rashes Neurological: no tremor with outstretched hands, DTR normal in all 4  ASSESSMENT: 1. Hashimoto's thyroiditis - 1 high TSH, normal TT4  PLAN:  1. Patient with newly dx'ed Hashimoto's thyroiditis, with 1 elevated TSH, not on levothyroxine therapy yet. She appears euthyroid. She does not appear to have  thyroid nodules, or  neck compression symptoms - I explained what Hashimoto's thyroiditis and subclinical thyroiditis mean - will check thyroid tests today: TSH, free T4, free T3 - if TSH still high, she may need to start LT4 - We discussed about correct intake of levothyroxine (LT4), fasting, with water, separated by at least 30 minutes from breakfast, and separated by more than 4 hours from calcium, iron, multivitamins, acid reflux medications (PPIs). - If these are abnormal, she will need to return in 6-8 weeks for repeat labs - If these are normal, I will see her back in 6 months, but she needs to alert me if she develops hypothyroid sxs (given list) until then  Office Visit on 10/11/2013  Component Date Value Ref Range Status  . TSH 10/11/2013 1.00  0.35 - 4.50 uIU/mL Final  . Free T4 10/11/2013 0.79  0.60 - 1.60 ng/dL Final  . T3, Free 16/10/960410/09/2013 3.0  2.3 - 4.2 pg/mL Final   TFTs normal >> Euthyroid Hashimoto's thyroiditis. See plan above.

## 2013-11-04 ENCOUNTER — Encounter: Payer: Self-pay | Admitting: Internal Medicine

## 2014-09-24 ENCOUNTER — Other Ambulatory Visit: Payer: Self-pay

## 2014-09-24 DIAGNOSIS — F419 Anxiety disorder, unspecified: Secondary | ICD-10-CM

## 2014-09-24 MED ORDER — FLUOXETINE HCL 20 MG PO CAPS: 20.0000 mg | ORAL_CAPSULE | Freq: Every day | ORAL | Status: DC

## 2014-12-22 ENCOUNTER — Other Ambulatory Visit: Payer: Self-pay

## 2014-12-22 DIAGNOSIS — F419 Anxiety disorder, unspecified: Secondary | ICD-10-CM

## 2014-12-25 ENCOUNTER — Telehealth: Payer: Self-pay | Admitting: *Deleted

## 2014-12-25 DIAGNOSIS — F419 Anxiety disorder, unspecified: Secondary | ICD-10-CM

## 2014-12-25 MED ORDER — FLUOXETINE HCL 20 MG PO CAPS: 20.0000 mg | ORAL_CAPSULE | Freq: Every day | ORAL | Status: DC

## 2014-12-25 NOTE — Telephone Encounter (Signed)
Pt called requesting refill on Prozac 20 mg , annual scheduled on 01/22/15. Okay to fill?

## 2014-12-25 NOTE — Telephone Encounter (Signed)
Yes please do refill 90 day ok,

## 2014-12-25 NOTE — Telephone Encounter (Signed)
Rx sent, left on voicemail this has been done. 

## 2015-01-22 ENCOUNTER — Encounter: Payer: Self-pay | Admitting: Women's Health

## 2015-01-22 ENCOUNTER — Ambulatory Visit (INDEPENDENT_AMBULATORY_CARE_PROVIDER_SITE_OTHER): Payer: Managed Care, Other (non HMO) | Admitting: Women's Health

## 2015-01-22 ENCOUNTER — Other Ambulatory Visit (HOSPITAL_COMMUNITY)
Admission: RE | Admit: 2015-01-22 | Discharge: 2015-01-22 | Disposition: A | Discharge status: Home / Self Care | Payer: Managed Care, Other (non HMO) | Source: Ambulatory Visit | Attending: Women's Health | Admitting: Women's Health

## 2015-01-22 VITALS — BP 126/80 | Ht 67.0 in | Wt 209.0 lb

## 2015-01-22 DIAGNOSIS — Z01419 Encounter for gynecological examination (general) (routine) without abnormal findings: Secondary | ICD-10-CM | POA: Diagnosis present

## 2015-01-22 DIAGNOSIS — Z13220 Encounter for screening for lipoid disorders: Secondary | ICD-10-CM | POA: Diagnosis not present

## 2015-01-22 DIAGNOSIS — Z1151 Encounter for screening for human papillomavirus (HPV): Secondary | ICD-10-CM | POA: Insufficient documentation

## 2015-01-22 DIAGNOSIS — Z1329 Encounter for screening for other suspected endocrine disorder: Secondary | ICD-10-CM | POA: Diagnosis not present

## 2015-01-22 LAB — CBC WITH DIFFERENTIAL/PLATELET
BASOS ABS: 0 10*3/uL (ref 0.0–0.1)
Basophils Relative: 0 % (ref 0–1)
Eosinophils Absolute: 0.2 10*3/uL (ref 0.0–0.7)
Eosinophils Relative: 3 % (ref 0–5)
HCT: 38.3 % (ref 36.0–46.0)
Hemoglobin: 13.1 g/dL (ref 12.0–15.0)
LYMPHS PCT: 33 % (ref 12–46)
Lymphs Abs: 1.8 10*3/uL (ref 0.7–4.0)
MCH: 30.5 pg (ref 26.0–34.0)
MCHC: 34.2 g/dL (ref 30.0–36.0)
MCV: 89.3 fL (ref 78.0–100.0)
MPV: 9.3 fL (ref 8.6–12.4)
Monocytes Absolute: 0.6 10*3/uL (ref 0.1–1.0)
Monocytes Relative: 10 % (ref 3–12)
NEUTROS PCT: 54 % (ref 43–77)
Neutro Abs: 3 10*3/uL (ref 1.7–7.7)
PLATELETS: 322 10*3/uL (ref 150–400)
RBC: 4.29 MIL/uL (ref 3.87–5.11)
RDW: 13.7 % (ref 11.5–15.5)
WBC: 5.6 10*3/uL (ref 4.0–10.5)

## 2015-01-22 LAB — LIPID PANEL
CHOL/HDL RATIO: 4.4 ratio (ref <=5.0)
CHOLESTEROL: 223 mg/dL — AB (ref 125–200)
HDL: 51 mg/dL (ref >=46)
LDL Cholesterol: 152 mg/dL — ABNORMAL HIGH (ref <130)
Triglycerides: 99 mg/dL (ref <150)
VLDL: 20 mg/dL (ref <30)

## 2015-01-22 LAB — COMPREHENSIVE METABOLIC PANEL
ALK PHOS: 59 U/L (ref 33–130)
ALT: 29 U/L (ref 6–29)
AST: 24 U/L (ref 10–35)
Albumin: 4.3 g/dL (ref 3.6–5.1)
BILIRUBIN TOTAL: 0.4 mg/dL (ref 0.2–1.2)
BUN: 17 mg/dL (ref 7–25)
CALCIUM: 9.8 mg/dL (ref 8.6–10.4)
CO2: 28 mmol/L (ref 20–31)
Chloride: 103 mmol/L (ref 98–110)
Creat: 0.8 mg/dL (ref 0.50–1.05)
GLUCOSE: 83 mg/dL (ref 65–99)
POTASSIUM: 4.8 mmol/L (ref 3.5–5.3)
Sodium: 140 mmol/L (ref 135–146)
TOTAL PROTEIN: 7.1 g/dL (ref 6.1–8.1)

## 2015-01-22 LAB — T4: T4 TOTAL: 8.2 ug/dL (ref 4.5–12.0)

## 2015-01-22 LAB — T3 UPTAKE: T3 UPTAKE: 27 % (ref 22–35)

## 2015-01-22 LAB — TSH: TSH: 3.936 u[IU]/mL (ref 0.350–4.500)

## 2015-01-22 NOTE — Patient Instructions (Signed)

## 2015-01-22 NOTE — Progress Notes (Signed)
Rebekah Aguilar Mah [birth year] [id number]    History:    Presents for annual exam.  Postmenopausal/no bleeding for 2 years with occasional hot flushes, states much better.  Normal Pap and mammogram history. History of positive thyroid antibodies with normal TSH.  Past medical history, past surgical history, family history and social history were all reviewed and documented in the EPIC chart. Works in Audiological scientist. Mother diabetes, hypertension died of lung cancer. [name] 15 doing well.  ROS:  A ROS was performed and pertinent positives and negatives are included.  Exam:  Filed Vitals:   01/22/15 0833  BP: 126/80    General appearance:  Normal Thyroid:  Symmetrical, normal in size, without palpable masses or nodularity. Respiratory  Auscultation:  Clear without wheezing or rhonchi Cardiovascular  Auscultation:  Regular rate, without rubs, murmurs or gallops  Edema/varicosities:  Not grossly evident Abdominal  Soft,nontender, without masses, guarding or rebound.  Liver/spleen:  No organomegaly noted  Hernia:  None appreciated  Skin  Inspection:  Grossly normal   Breasts: Examined lying and sitting.     Right: Without masses, retractions, discharge or axillary adenopathy.     Left: Without masses, retractions, discharge or axillary adenopathy. Gentitourinary   Inguinal/mons:  Normal without inguinal adenopathy  External genitalia:  Normal  BUS/Urethra/Skene's glands:  Normal  Vagina:  Normal  Cervix:  Normal  Uterus:   normal in size, shape and contour.  Midline and mobile  Adnexa/parametria:     Rt: Without masses or tenderness.   Lt: Without masses or tenderness.  Anus and perineum: Normal  Digital rectal exam: Normal sphincter tone without palpated masses or tenderness  Assessment/Plan:  50 y.o. MWF G3 P1  for annual exam no complaints.  Postmenopausal/no HRT/no bleeding Obesity History of Hashimoto thyroiditis -  normal TSH  Plan: SBE's, continue annual screening  mammogramhas scheduled. Increase regular exercise, decrease calories for weight loss encouraged. Calcium rich diet, vitamin D 1000 daily encouraged. CBC, lipid panel, CMP, thyroid panel, UA, Pap with HR HPV typing, new screening guidelines reviewed. Strongly encouraged screening colonoscopy, [facility] information given and reviewed. If thyroid panel abnormal wall printout results and follow-up with Dr. Elvera Lennox.      Harrington Challenger [facility], 9:14 AM 01/22/2015

## 2015-01-23 LAB — CYTOLOGY - PAP

## 2015-01-23 LAB — URINALYSIS W MICROSCOPIC + REFLEX CULTURE
BILIRUBIN URINE: NEGATIVE
Bacteria, UA: NONE SEEN [HPF]
CRYSTALS: NONE SEEN [HPF]
Casts: NONE SEEN [LPF]
GLUCOSE, UA: NEGATIVE
Hgb urine dipstick: NEGATIVE
KETONES UR: NEGATIVE
NITRITE: NEGATIVE
PH: 5 (ref 5.0–8.0)
Protein, ur: NEGATIVE
SPECIFIC GRAVITY, URINE: 1.018 (ref 1.001–1.035)
Yeast: NONE SEEN [HPF]

## 2015-01-23 LAB — THYROID ANTIBODIES
THYROGLOBULIN AB: 3 [IU]/mL — AB (ref <2)
Thyroperoxidase Ab SerPl-aCnc: 277 IU/mL — ABNORMAL HIGH (ref <9)

## 2015-01-24 LAB — URINE CULTURE
Colony Count: NO GROWTH
ORGANISM ID, BACTERIA: NO GROWTH

## 2015-02-02 ENCOUNTER — Encounter: Payer: Self-pay | Admitting: Women's Health

## 2015-02-03 ENCOUNTER — Encounter: Payer: Self-pay | Admitting: Women's Health

## 2015-03-25 ENCOUNTER — Other Ambulatory Visit: Payer: Self-pay

## 2015-03-25 DIAGNOSIS — F419 Anxiety disorder, unspecified: Secondary | ICD-10-CM

## 2015-03-25 MED ORDER — FLUOXETINE HCL 20 MG PO CAPS: 20.0000 mg | ORAL_CAPSULE | Freq: Every day | ORAL | Status: DC

## 2016-01-29 ENCOUNTER — Encounter: Payer: Managed Care, Other (non HMO) | Admitting: Women's Health

## 2016-02-03 ENCOUNTER — Encounter: Payer: Self-pay | Admitting: Women's Health

## 2016-02-03 ENCOUNTER — Other Ambulatory Visit: Payer: Self-pay | Admitting: Women's Health

## 2016-02-03 ENCOUNTER — Encounter: Payer: Managed Care, Other (non HMO) | Admitting: Women's Health

## 2016-02-03 ENCOUNTER — Ambulatory Visit (INDEPENDENT_AMBULATORY_CARE_PROVIDER_SITE_OTHER): Payer: Managed Care, Other (non HMO) | Admitting: Women's Health

## 2016-02-03 VITALS — BP 126/84 | Ht 67.0 in | Wt 218.0 lb

## 2016-02-03 DIAGNOSIS — Z13220 Encounter for screening for lipoid disorders: Secondary | ICD-10-CM

## 2016-02-03 DIAGNOSIS — R5383 Other fatigue: Secondary | ICD-10-CM

## 2016-02-03 DIAGNOSIS — F419 Anxiety disorder, unspecified: Secondary | ICD-10-CM

## 2016-02-03 DIAGNOSIS — Z01419 Encounter for gynecological examination (general) (routine) without abnormal findings: Secondary | ICD-10-CM

## 2016-02-03 LAB — URINALYSIS W MICROSCOPIC + REFLEX CULTURE
BILIRUBIN URINE: NEGATIVE
CRYSTALS: NONE SEEN [HPF]
Casts: NONE SEEN [LPF]
GLUCOSE, UA: NEGATIVE
Hgb urine dipstick: NEGATIVE
KETONES UR: NEGATIVE
Leukocytes, UA: NEGATIVE
Nitrite: NEGATIVE
PROTEIN: NEGATIVE
RBC / HPF: NONE SEEN RBC/HPF (ref <=2)
SPECIFIC GRAVITY, URINE: 1.015 (ref 1.001–1.035)
Yeast: NONE SEEN [HPF]
pH: 7 (ref 5.0–8.0)

## 2016-02-03 LAB — CBC WITH DIFFERENTIAL/PLATELET
BASOS ABS: 0 {cells}/uL (ref 0–200)
Basophils Relative: 0 %
EOS PCT: 2 %
Eosinophils Absolute: 144 cells/uL (ref 15–500)
HCT: 39.1 % (ref 35.0–45.0)
Hemoglobin: 13.3 g/dL (ref 11.7–15.5)
Lymphocytes Relative: 31 %
Lymphs Abs: 2232 cells/uL (ref 850–3900)
MCH: 30.2 pg (ref 27.0–33.0)
MCHC: 34 g/dL (ref 32.0–36.0)
MCV: 88.9 fL (ref 80.0–100.0)
MONOS PCT: 11 %
MPV: 9.4 fL (ref 7.5–12.5)
Monocytes Absolute: 792 cells/uL (ref 200–950)
NEUTROS PCT: 56 %
Neutro Abs: 4032 cells/uL (ref 1500–7800)
Platelets: 336 10*3/uL (ref 140–400)
RBC: 4.4 MIL/uL (ref 3.80–5.10)
RDW: 13.6 % (ref 11.0–15.0)
WBC: 7.2 10*3/uL (ref 3.8–10.8)

## 2016-02-03 LAB — TSH: TSH: 27.13 mIU/L — ABNORMAL HIGH

## 2016-02-03 MED ORDER — FLUOXETINE HCL 20 MG PO CAPS: 20.0000 mg | ORAL_CAPSULE | Freq: Every day | ORAL | 4 refills | Status: DC

## 2016-02-03 NOTE — Progress Notes (Signed)
Isaiah SergeMisty J Reliford 06/06/1965 [id number]    History:    Presents for annual exam.  Postmenopausal on no HRT with no bleeding. Normal Pap and mammogram history. Had mammogram today. History of Hashimoto with normal thyroid function after. Has not had a screening colonoscopy. Anxiety/depression stable on Prozac.  Past medical history, past surgical history, family history and social history were all reviewed and documented in the EPIC chart. 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 Mother diabetes, hypertension died of lung cancer. 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  ROS:  A ROS was performed and pertinent positives and negatives are included.  Exam:  Vitals:   02/03/16 1519  BP: 126/84  Weight: 218 lb (98.9 kg)  Height: 5\' 7"  (1.702 m)   Body mass index is 34.14 kg/m.   General appearance:  Normal Thyroid:  Symmetrical, normal in size, without palpable masses or nodularity. Respiratory  Auscultation:  Clear without wheezing or rhonchi Cardiovascular  Auscultation:  Regular rate, without rubs, murmurs or gallops  Edema/varicosities:  Not grossly evident Abdominal  Soft,nontender, without masses, guarding or rebound.  Liver/spleen:  No organomegaly noted  Hernia:  None appreciated  Skin  Inspection:  Grossly normal   Breasts: Examined lying and sitting.     Right: Without masses, retractions, discharge or axillary adenopathy.     Left: Without masses, retractions, discharge or axillary adenopathy. Gentitourinary   Inguinal/mons:  Normal without inguinal adenopathy  External genitalia:  Normal  BUS/Urethra/Skene's glands:  Normal  Vagina:  Normal  Cervix:  Normal  Uterus:  normal in size, shape and contour.  Midline and mobile  Adnexa/parametria:     Rt: Without masses or tenderness.   Lt: Without masses or tenderness.  Anus and perineum: Normal  Digital rectal exam: Normal sphincter tone without palpated masses or tenderness  Assessment/Plan:  51 y.o. M WF  G1 2 P 2 (placed 1 for adoption) for annual exam no complaints.  Postmenopausal/no HRT/no bleeding Obesity Anxiety and depression stable on Prozac  Plan: Prilosec 20 mg by mouth daily prescription, proper use given and reviewed. Reviewed importance of leisure, exercise, counseling as needed. SBE's, continue annual screening mammogram, calcium rich diet, decrease calories and increase exercise for weight loss,  vitamin D 1000 daily encouraged. Home safety, fall prevention and importance of weightbearing exercise for bone health reviewed. CBC, lipid panel, CMP, TSH, UA, Pap normal with negative HR HPV 2017, new screening guidelines reviewed.Marland Kitchen.    Harrington ChallengerYOUNG,NANCY J [facility] Surgery CenterWHNP, 4:53 PM 02/03/2016

## 2016-02-03 NOTE — Patient Instructions (Signed)
Colonoscopy  Dr [facility]  [phone]  Health Maintenance for Postmenopausal Women Introduction Menopause is a normal process in which your reproductive ability comes to an end. This process happens gradually over a span of months to years, usually between the ages of 57 and 47. Menopause is complete when you have missed 12 consecutive menstrual periods. It is important to talk with your health care provider about some of the most common conditions that affect postmenopausal women, such as heart disease, cancer, and bone loss (osteoporosis). Adopting a healthy lifestyle and getting preventive care can help to promote your health and wellness. Those actions can also lower your chances of developing some of these common conditions. What should I know about menopause? During menopause, you may experience a number of symptoms, such as:  Moderate-to-severe hot flashes.  Night sweats.  Decrease in sex drive.  Mood swings.  Headaches.  Tiredness.  Irritability.  Memory problems.  Insomnia. Choosing to treat or not to treat menopausal changes is an individual decision that you make with your health care provider. What should I know about hormone replacement therapy and supplements? Hormone therapy products are effective for treating symptoms that are associated with menopause, such as hot flashes and night sweats. Hormone replacement carries certain risks, especially as you become older. If you are thinking about using estrogen or estrogen with progestin treatments, discuss the benefits and risks with your health care provider. What should I know about heart disease and stroke? Heart disease, heart attack, and stroke become more likely as you age. This may be due, in part, to the hormonal changes that your body experiences during menopause. These can affect how your body processes dietary fats, triglycerides, and cholesterol. Heart attack and stroke are both medical emergencies. There  are many things that you can do to help prevent heart disease and stroke:  Have your blood pressure checked at least every 1-2 years. High blood pressure causes heart disease and increases the risk of stroke.  If you are 34-52 years old, ask your health care provider if you should take aspirin to prevent a heart attack or a stroke.  Do not use any tobacco products, including cigarettes, chewing tobacco, or electronic cigarettes. If you need help quitting, ask your health care provider.  It is important to eat a healthy diet and maintain a healthy weight.  Be sure to include plenty of vegetables, fruits, low-fat dairy products, and lean protein.  Avoid eating foods that are high in solid fats, added sugars, or salt (sodium).  Get regular exercise. This is one of the most important things that you can do for your health.  Try to exercise for at least 150 minutes each week. The type of exercise that you do should increase your heart rate and make you sweat. This is known as moderate-intensity exercise.  Try to do strengthening exercises at least twice each week. Do these in addition to the moderate-intensity exercise.  Know your numbers.Ask your health care provider to check your cholesterol and your blood glucose. Continue to have your blood tested as directed by your health care provider. What should I know about cancer screening? There are several types of cancer. Take the following steps to reduce your risk and to catch any cancer development as early as possible. Breast Cancer  Practice breast self-awareness.  This means understanding how your breasts normally appear and feel.  It also means doing regular breast self-exams. Let your health care provider know about any changes,  no matter how small.  If you are 82 or older, have a clinician do a breast exam (clinical breast exam or CBE) every year. Depending on your age, family history, and medical history, it may be recommended that  you also have a yearly breast X-ray (mammogram).  If you have a family history of breast cancer, talk with your health care provider about genetic screening.  If you are at high risk for breast cancer, talk with your health care provider about having an MRI and a mammogram every year.  Breast cancer (BRCA) gene test is recommended for women who have family members with BRCA-related cancers. Results of the assessment will determine the need for genetic counseling and BRCA1 and for BRCA2 testing. BRCA-related cancers include these types:  Breast. This occurs in males or females.  Ovarian.  Tubal. This may also be called fallopian tube cancer.  Cancer of the abdominal or pelvic lining (peritoneal cancer).  Prostate.  Pancreatic. Cervical, Uterine, and Ovarian Cancer  Your health care provider may recommend that you be screened regularly for cancer of the pelvic organs. These include your ovaries, uterus, and vagina. This screening involves a pelvic exam, which includes checking for microscopic changes to the surface of your cervix (Pap test).  For women ages 21-65, health care providers may recommend a pelvic exam and a Pap test every three years. For women ages 71-65, they may recommend the Pap test and pelvic exam, combined with testing for human papilloma virus (HPV), every five years. Some types of HPV increase your risk of cervical cancer. Testing for HPV may also be done on women of any age who have unclear Pap test results.  Other health care providers may not recommend any screening for nonpregnant women who are considered low risk for pelvic cancer and have no symptoms. Ask your health care provider if a screening pelvic exam is right for you.  If you have had past treatment for cervical cancer or a condition that could lead to cancer, you need Pap tests and screening for cancer for at least 20 years after your treatment. If Pap tests have been discontinued for you, your risk factors  (such as having a new sexual partner) need to be reassessed to determine if you should start having screenings again. Some women have medical problems that increase the chance of getting cervical cancer. In these cases, your health care provider may recommend that you have screening and Pap tests more often.  If you have a family history of uterine cancer or ovarian cancer, talk with your health care provider about genetic screening.  If you have vaginal bleeding after reaching menopause, tell your health care provider.  There are currently no reliable tests available to screen for ovarian cancer. Lung Cancer  Lung cancer screening is recommended for adults 64-54 years old who are at high risk for lung cancer because of a history of smoking. A yearly low-dose CT scan of the lungs is recommended if you:  Currently smoke.  Have a history of at least 30 pack-years of smoking and you currently smoke or have quit within the past 15 years. A pack-year is smoking an average of one pack of cigarettes per day for one year. Yearly screening should:  Continue until it has been 15 years since you quit.  Stop if you develop a health problem that would prevent you from having lung cancer treatment. Colorectal Cancer  This type of cancer can be detected and can often be prevented.  Routine colorectal cancer screening usually begins at age 4 and continues through age 4.  If you have risk factors for colon cancer, your health care provider may recommend that you be screened at an earlier age.  If you have a family history of colorectal cancer, talk with your health care provider about genetic screening.  Your health care provider may also recommend using home test kits to check for hidden blood in your stool.  A small camera at the end of a tube can be used to examine your colon directly (sigmoidoscopy or colonoscopy). This is done to check for the earliest forms of colorectal cancer.  Direct  examination of the colon should be repeated every 5-10 years until age 74. However, if early forms of precancerous polyps or small growths are found or if you have a family history or genetic risk for colorectal cancer, you may need to be screened more often. Skin Cancer  Check your skin from head to toe regularly.  Monitor any moles. Be sure to tell your health care provider:  About any new moles or changes in moles, especially if there is a change in a mole's shape or color.  If you have a mole that is larger than the size of a pencil eraser.  If any of your family members has a history of skin cancer, especially at a Sherrilynn Gudgel age, talk with your health care provider about genetic screening.  Always use sunscreen. Apply sunscreen liberally and repeatedly throughout the day.  Whenever you are outside, protect yourself by wearing long sleeves, pants, a wide-brimmed hat, and sunglasses. What should I know about osteoporosis? Osteoporosis is a condition in which bone destruction happens more quickly than new bone creation. After menopause, you may be at an increased risk for osteoporosis. To help prevent osteoporosis or the bone fractures that can happen because of osteoporosis, the following is recommended:  If you are 63-40 years old, get at least 1,000 mg of calcium and at least 600 mg of vitamin D per day.  If you are older than age 63 but younger than age 75, get at least 1,200 mg of calcium and at least 600 mg of vitamin D per day.  If you are older than age 10, get at least 1,200 mg of calcium and at least 800 mg of vitamin D per day. Smoking and excessive alcohol intake increase the risk of osteoporosis. Eat foods that are rich in calcium and vitamin D, and do weight-bearing exercises several times each week as directed by your health care provider. What should I know about how menopause affects my mental health? Depression may occur at any age, but it is more common as you become older.  Common symptoms of depression include:  Low or sad mood.  Changes in sleep patterns.  Changes in appetite or eating patterns.  Feeling an overall lack of motivation or enjoyment of activities that you previously enjoyed.  Frequent crying spells. Talk with your health care provider if you think that you are experiencing depression. What should I know about immunizations? It is important that you get and maintain your immunizations. These include:  Tetanus, diphtheria, and pertussis (Tdap) booster vaccine.  Influenza every year before the flu season begins.  Pneumonia vaccine.  Shingles vaccine. Your health care provider may also recommend other immunizations. This information is not intended to replace advice given to you by your health care provider. Make sure you discuss any questions you have with your health care provider. Document  Released: 02/11/2005 Document Revised: 07/10/2015 Document Reviewed: 09/23/2014  2017 [employer]

## 2016-02-04 LAB — URINE CULTURE: ORGANISM ID, BACTERIA: NO GROWTH

## 2016-02-04 LAB — COMPREHENSIVE METABOLIC PANEL
ALT: 34 U/L — ABNORMAL HIGH (ref 6–29)
AST: 25 U/L (ref 10–35)
Albumin: 4.5 g/dL (ref 3.6–5.1)
Alkaline Phosphatase: 60 U/L (ref 33–130)
BUN: 13 mg/dL (ref 7–25)
CHLORIDE: 102 mmol/L (ref 98–110)
CO2: 26 mmol/L (ref 20–31)
CREATININE: 0.8 mg/dL (ref 0.50–1.05)
Calcium: 9.3 mg/dL (ref 8.6–10.4)
Glucose, Bld: 89 mg/dL (ref 65–99)
Potassium: 4.3 mmol/L (ref 3.5–5.3)
SODIUM: 139 mmol/L (ref 135–146)
Total Bilirubin: 0.4 mg/dL (ref 0.2–1.2)
Total Protein: 7.3 g/dL (ref 6.1–8.1)

## 2016-02-04 LAB — LIPID PANEL
Cholesterol: 222 mg/dL — ABNORMAL HIGH (ref <200)
HDL: 45 mg/dL — ABNORMAL LOW (ref >50)
LDL CALC: 118 mg/dL — AB (ref <100)
TRIGLYCERIDES: 297 mg/dL — AB (ref <150)
Total CHOL/HDL Ratio: 4.9 Ratio (ref <5.0)
VLDL: 59 mg/dL — AB (ref <30)

## 2016-02-05 LAB — T4, FREE: FREE T4: 0.8 ng/dL (ref 0.8–1.8)

## 2016-02-06 LAB — T3: T3, Total: 139 ng/dL (ref 76–181)

## 2016-02-09 ENCOUNTER — Encounter: Payer: Self-pay | Admitting: Women's Health

## 2016-02-09 LAB — THYROID ANTIBODIES: THYROGLOBULIN AB: 22 [IU]/mL — AB (ref <2)

## 2016-04-13 ENCOUNTER — Encounter: Payer: Self-pay | Admitting: Internal Medicine

## 2016-04-13 ENCOUNTER — Ambulatory Visit (INDEPENDENT_AMBULATORY_CARE_PROVIDER_SITE_OTHER): Payer: Managed Care, Other (non HMO) | Admitting: Internal Medicine

## 2016-04-13 VITALS — BP 124/84 | HR 91 | Wt 219.0 lb

## 2016-04-13 DIAGNOSIS — E063 Autoimmune thyroiditis: Secondary | ICD-10-CM

## 2016-04-13 DIAGNOSIS — E038 Other specified hypothyroidism: Secondary | ICD-10-CM

## 2016-04-13 LAB — TSH: TSH: 11.43 u[IU]/mL — AB (ref 0.35–4.50)

## 2016-04-13 LAB — T3, FREE: T3, Free: 3.5 pg/mL (ref 2.3–4.2)

## 2016-04-13 LAB — T4, FREE: Free T4: 0.76 ng/dL (ref 0.60–1.60)

## 2016-04-13 NOTE — Patient Instructions (Addendum)
Please stop at the lab.  If the TSH is still high, start Levothyroxine 50 mcg daily.  Take the thyroid hormone every day, with water, at least 30 minutes before breakfast, separated by at least 4 hours from: - acid reflux medications - calcium - iron - multivitamins  Please move the Prilosec and multivitamins to lunchtime or later.  Come back for repeat las in 5-6 weeks.  Please come back for a follow-up appointment in 4 months.

## 2016-04-13 NOTE — Progress Notes (Signed)
Patient ID: [name], female   DOB: 14-May-1965, 51 y.o.   MRN: [id number]   HPI  JOZY MCPHEARSON is a 51 y.o.-year-old female, initially referred by Maryelizabeth Rowan, NP, now returning for Hashimoto's hypothyroidism. Last visit 10/2013.  Reviewed and addended history: Pt. has been found to have a high TSH in 09/2013, however, this normalized on subsequent tests. Her free thyroid hormones were normal. Her TPO antibodies were very high, confirming Hashimoto thyroiditis, which, at the time of our last visit, was euthyroid.  Patient had a clearly elevated TSH as checked in 01/2016 and she was referred back to endocrinology.   I reviewed pt's thyroid tests: Lab Results  Component Value Date   TSH 27.13 (H) 02/03/2016   TSH 3.936 01/22/2015   TSH 1.00 10/11/2013   TSH 5.585 (H) 09/12/2013   FREET4 0.8 02/03/2016   FREET4 0.79 10/11/2013   T3FREE 3.0 10/11/2013   Thyroid Ab's were high: Component     Latest Ref Rng & Units 09/12/2013 02/03/2016  Thyroperoxidase Ab SerPl-aCnc     <9 IU/mL 617 (H) >900 (H)  Thyroglobulin Ab     <2 IU/mL 26 (H) 22 (H)   She is on MVI, PPI in am.  Pt describes: - + weight gain - slow - + fatigue - + joint pain - no cold intolerance - no constipation/no diarrhea - no dry skin - no hair falling - no anxiety/no depression  Pt denies feeling nodules in neck, hoarseness, dysphagia/odynophagia, SOB with lying down.  She has no FH of thyroid disorders. No FH of thyroid cancer.  No h/o radiation tx to head or neck. No recent use of iodine supplements.  I reviewed her chart and she also has a history of GERD; joint pain.   ROS: Constitutional: see HPI Eyes: no blurry vision, no xerophthalmia ENT: no sore throat, no nodules palpated in throat, no dysphagia/odynophagia, no hoarseness Cardiovascular: no CP/SOB/palpitations/leg swelling Respiratory: no cough/SOB Gastrointestinal: no N/D/C/acid reflux Musculoskeletal: no muscle/+ joint aches Skin: no  rashes Neurological: no tremors/numbness/tingling/dizziness Psychiatric: no depression/anxiety  Past Medical History:  Diagnosis Date  . Acid reflux   . PMS (premenstrual syndrome)    Past Surgical History:  Procedure Laterality Date  . GASTROSTOMY TUBE PLACEMENT  9/04  . TONSILLECTOMY AND ADENOIDECTOMY     CHILD AGE   History   Social History  . Marital Status: Married    Spouse Name: N/A    Number of Children: 2   Occupational History  . accountant   Social History Main Topics  . Smoking status: Never Smoker   . Smokeless tobacco: Never Used  . Alcohol Use: Yes     Comment: occassionally  . Drug Use: No  . Sexual Activity: Yes    Partners: Male    Birth Control/ Protection: Condom    Social drinker: beer    Current Outpatient Prescriptions on File Prior to Visit  Medication Sig Dispense Refill  . FLUoxetine (PROZAC) 20 MG capsule Take 1 capsule (20 mg total) by mouth daily. 90 capsule 4  . Loratadine (CLARITIN PO) as needed. ALLERGIES     . Multiple Vitamins-Minerals (CENTRUM PO) daily.      . Omeprazole (PRILOSEC PO) as needed. REFLUX      No current facility-administered medications on file prior to visit.    No Known Allergies Family History  Problem Relation Age of Onset  . Hypertension Mother   . Lung cancer Mother     DIED IN 2022-02-27  .  Diabetes Mother   . Diabetes Maternal Grandmother    PE: BP 124/84 (BP Location: Left Arm, Patient Position: Sitting)   Pulse 91   Wt 219 lb (99.3 kg)   SpO2 98%   BMI 34.30 kg/m  Wt Readings from Last 3 Encounters:  04/13/16 219 lb (99.3 kg)  02/03/16 218 lb (98.9 kg)  01/22/15 209 lb (94.8 kg)   Constitutional: overweight, in NAD Eyes: PERRLA, EOMI, no exophthalmos ENT: moist mucous membranes, + symmetric thyromegaly, no nodules palpated, no cervical lymphadenopathy Cardiovascular: RRR, No MRG Respiratory: CTA B Gastrointestinal: abdomen soft, NT, ND, BS+ Musculoskeletal: no deformities, strength intact  in all 4 Skin: moist, warm, no rashes Neurological: no tremor with outstretched hands, DTR normal in all 4  ASSESSMENT: 1. Hashimoto's hypothyroidism  PLAN:  1. Patient with h/o euthyroid Hashimoto's thyroiditis, now hypothyroid. She does not appear to have  thyroid nodules, or neck compression symptoms. She has not noticed dramatic changes in her health since our last visit. She noticed some weight gain, she continues to have mild fatigue, but has noticed more joint pain. We reviewed together her previous thyroid levels, and I explained that the latest TSH of 27 is quite high, and requires treatment. We again discussed about symptoms of hypothyroidism, and the fact that they usually improve after improving TFTs. She agrees to start levothyroxine. - will check thyroid tests today: TSH, free T4, free T3 - if TSH still high, we will need to start LT4 - We discussed about correct intake of levothyroxine (LT4), fasting, with water, separated by at least 30 minutes from breakfast, and separated by more than 4 hours from calcium, iron, multivitamins, acid reflux medications (PPIs). I advised her to move her multivitamins and acid reflux medicines later in the day - she will need to return in 6 weeks for repeat labs - I will see her back in 4 months  Office Visit on 04/13/2016  Component Date Value Ref Range Status  . TSH 04/13/2016 11.43* 0.35 - 4.50 uIU/mL Final  . Free T4 04/13/2016 0.76  0.60 - 1.60 ng/dL Final   Comment: Specimens from patients who are undergoing biotin therapy and /or ingesting biotin supplements may contain high levels of biotin.  The higher biotin concentration in these specimens interferes with this Free T4 assay.  Specimens that contain high levels  of biotin may cause false high results for this Free T4 assay.  Please interpret results in light of the total clinical presentation of the patient.    . T3, Free 04/13/2016 3.5  2.3 - 4.2 pg/mL Final   TSH still high >> I will  suggest to start LT4 25 mcg daily and come back for labs in 6 weeks.  Carlus Pavlov, MD PhD [facility] Endocrinology

## 2016-04-14 MED ORDER — LEVOTHYROXINE SODIUM 25 MCG PO TABS: 25.0000 ug | ORAL_TABLET | Freq: Every day | ORAL | 5 refills | Status: DC

## 2016-05-18 ENCOUNTER — Encounter: Payer: Self-pay | Admitting: Gynecology

## 2016-05-25 ENCOUNTER — Other Ambulatory Visit (INDEPENDENT_AMBULATORY_CARE_PROVIDER_SITE_OTHER): Payer: Managed Care, Other (non HMO)

## 2016-05-25 DIAGNOSIS — E038 Other specified hypothyroidism: Secondary | ICD-10-CM

## 2016-05-25 DIAGNOSIS — E063 Autoimmune thyroiditis: Secondary | ICD-10-CM | POA: Diagnosis not present

## 2016-05-25 LAB — T4, FREE: Free T4: 0.79 ng/dL (ref 0.60–1.60)

## 2016-05-25 LAB — TSH: TSH: 7.8 u[IU]/mL — ABNORMAL HIGH (ref 0.35–4.50)

## 2016-05-26 MED ORDER — LEVOTHYROXINE SODIUM 50 MCG PO TABS: 50.0000 ug | ORAL_TABLET | Freq: Every day | ORAL | 2 refills | Status: DC

## 2016-06-06 ENCOUNTER — Telehealth: Payer: Self-pay | Admitting: Internal Medicine

## 2016-06-06 ENCOUNTER — Other Ambulatory Visit: Payer: Self-pay

## 2016-06-06 MED ORDER — LEVOTHYROXINE SODIUM 50 MCG PO TABS: 50.0000 ug | ORAL_TABLET | Freq: Every day | ORAL | 2 refills | Status: DC

## 2016-06-06 NOTE — Telephone Encounter (Signed)
Submitted

## 2016-06-06 NOTE — Telephone Encounter (Signed)
Patient called to request that we submit the 50 mcg levothyroxine since dosage was changed to [facility] [place] on file.

## 2016-08-12 ENCOUNTER — Encounter: Payer: Self-pay | Admitting: Internal Medicine

## 2016-08-12 ENCOUNTER — Ambulatory Visit (INDEPENDENT_AMBULATORY_CARE_PROVIDER_SITE_OTHER): Payer: Managed Care, Other (non HMO) | Admitting: Internal Medicine

## 2016-08-12 VITALS — BP 118/72 | HR 82 | Wt 216.0 lb

## 2016-08-12 DIAGNOSIS — E063 Autoimmune thyroiditis: Secondary | ICD-10-CM

## 2016-08-12 DIAGNOSIS — E038 Other specified hypothyroidism: Secondary | ICD-10-CM | POA: Diagnosis not present

## 2016-08-12 LAB — T4, FREE: Free T4: 0.89 ng/dL (ref 0.60–1.60)

## 2016-08-12 LAB — TSH: TSH: 4.44 u[IU]/mL (ref 0.35–4.50)

## 2016-08-12 MED ORDER — LEVOTHYROXINE SODIUM 75 MCG PO TABS: 75.0000 ug | ORAL_TABLET | Freq: Every day | ORAL | 2 refills | Status: DC

## 2016-08-12 NOTE — Progress Notes (Signed)
Patient ID: Rebekah Aguilar, female   DOB: 02/18/1965, 51 y.o.   MRN: [id number]   HPI  Rebekah Aguilar is a 51 y.o.-year-old female, initially referred by Maryelizabeth RowanNancy Young, NP, now returning for Hashimoto's hypothyroidism. Last visit 10/2013.  Reviewed and addended history: Pt. has been found to have a high TSH in 09/2013, however, this normalized on subsequent tests. Her free thyroid hormones were normal. Her TPO antibodies were very high, confirming Hashimoto thyroiditis, which, at the time of our last visit, was euthyroid.  Patient had a clearly elevated TSH as checked in 01/2016 and she was referred back to endocrinology.   We started levothyroxine at 25 g daily in 04/2016 and then increased to 50 mcg daily in 06/2016. She tells me she does not feel differently on the LT4.  Pt is taking the levothyroxine: - in am - fasting - with water - coffee + creamer 1h later - at least 1h from b'fast - no Ca, Fe - + MVI, Prilosec - both later in the day (lunch) - not on Biotin  I reviewed pt's thyroid tests: Lab Results  Component Value Date   TSH 7.80 (H) 05/25/2016   TSH 11.43 (H) 04/13/2016   TSH 27.13 (H) 02/03/2016   TSH 3.936 01/22/2015   TSH 1.00 10/11/2013   TSH 5.585 (H) 09/12/2013   FREET4 0.79 05/25/2016   FREET4 0.76 04/13/2016   FREET4 0.8 02/03/2016   FREET4 0.79 10/11/2013   T3FREE 3.5 04/13/2016   T3FREE 3.0 10/11/2013   Thyroid Ab's Were high, indicating Hashimoto's thyroiditis: Component     Latest Ref Rng & Units 09/12/2013 02/03/2016  Thyroperoxidase Ab SerPl-aCnc     <9 IU/mL 617 (H) >900 (H)  Thyroglobulin Ab     <2 IU/mL 26 (H) 22 (H)   Pt denies: - feeling nodules in neck - hoarseness - dysphagia - choking - SOB with lying down  She has no FH of thyroid disorders. No FH of thyroid cancer. No h/o radiation tx to head or neck.  No seaweed or kelp. No recent contrast studies. No herbal supplements. No Biotin use. No recent steroids use.   I reviewed her  chart and she also has a history of GERD; joint pain.   ROS: Constitutional: + weight gain/no weight loss, + fatigue, no subjective hyperthermia, no subjective hypothermia Eyes: no blurry vision, no xerophthalmia ENT: no sore throat, no nodules palpated in throat, no dysphagia, no odynophagia, no hoarseness Cardiovascular: no CP/no SOB/no palpitations/no leg swelling Respiratory: no cough/no SOB/no wheezing Gastrointestinal: no N/no V/no D/no C/no acid reflux Musculoskeletal: no muscle aches/+ joint aches Skin: no rashes, no hair loss Neurological: no tremors/no numbness/no tingling/no dizziness  I reviewed pt's medications, allergies, PMH, social hx, family hx, and changes were documented in the history of present illness. Otherwise, unchanged from my initial visit note.  Past Medical History:  Diagnosis Date  . Acid reflux   . PMS (premenstrual syndrome)    Past Surgical History:  Procedure Laterality Date  . GASTROSTOMY TUBE PLACEMENT  9/04  . TONSILLECTOMY AND ADENOIDECTOMY     CHILD AGE   History   Social History  . Marital Status: Married    Spouse Name: N/A    Number of Children: 2   Occupational History  . accountant   Social History Main Topics  . Smoking status: Never Smoker   . Smokeless tobacco: Never Used  . Alcohol Use: Yes     Comment: occassionally  . Drug Use: No  .  Sexual Activity: Yes    Partners: Male    Birth Control/ Protection: Condom    Social drinker: beer    Current Outpatient Prescriptions on File Prior to Visit  Medication Sig Dispense Refill  . FLUoxetine (PROZAC) 20 MG capsule Take 1 capsule (20 mg total) by mouth daily. 90 capsule 4  . levothyroxine (SYNTHROID, LEVOTHROID) 50 MCG tablet Take 1 tablet (50 mcg total) by mouth daily before breakfast. 60 tablet 2  . Loratadine (CLARITIN PO) as needed. ALLERGIES     . Multiple Vitamins-Minerals (CENTRUM PO) daily.      . Omeprazole (PRILOSEC PO) as needed. REFLUX      No current  facility-administered medications on file prior to visit.    No Known Allergies Family History  Problem Relation Age of Onset  . Hypertension Mother   . Lung cancer Mother        DIED IN February 18, 2022  . Diabetes Mother   . Diabetes Maternal Grandmother    PE: BP 118/72   Pulse 82   Wt 216 lb (98 kg)   SpO2 96%   BMI 33.83 kg/m  Wt Readings from Last 3 Encounters:  08/12/16 216 lb (98 kg)  04/13/16 219 lb (99.3 kg)  02/03/16 218 lb (98.9 kg)   Constitutional: overweight, in NAD Eyes: PERRLA, EOMI, no exophthalmos ENT: moist mucous membranes, + Symmetric thyromegaly, no cervical lymphadenopathy Cardiovascular: RRR, No MRG Respiratory: CTA B Gastrointestinal: abdomen soft, NT, ND, BS+ Musculoskeletal: no deformities, strength intact in all 4 Skin: moist, warm, no rashes Neurological: no tremor with outstretched hands, DTR normal in all 4  ASSESSMENT: 1. Hashimoto's hypothyroidism  PLAN:  1. Patient with h/o euthyroid Hashimoto's thyroiditis >> became hypothyroid this year >> started LT4 25 mcg daily in 04/2016 - latest thyroid labs reviewed with pt (from 05/2016) >> TSH elevated, therefore, we increased her levothyroxine further to 50 g daily in 06/2016 - we had a long discussion about her Hashimoto thyroiditis diagnosis. I explained that this is an autoimmune disorder, in which she develops antibodies against her own thyroid. The antibodies bind to the thyroid tissue and cause inflammation, and, eventually, destruction of the gland and hypothyroidism. We don't know how long this process can be, it can last from months to years.  - I also explained that thyroid enlargement especially at the beginning of her Hashimoto thyroiditis course is not uncommon, and it has a waxing and waning character. She did not have this so far. - We discussed about treatment for Hashimoto thyroiditis, which is actually limited to thyroid hormones in case her TFTs are abnormal. Supplements like selenium has  been tried with various results, some showing improvement in the TPO antibodies. However, there are no randomized controlled trials of this are consistent results between trials. We also discussed about ways to improve her immune system (relaxation, diet, exercise, sleep) to reduce the Ab titer and, subsequently, the thyroid inflammation. - will continue LT4 50 mcg daily and she agrees to try Selenium 200 mcg daily as her TPO Abs have been undetectably high - we discussed about taking the thyroid hormone every day, with water, >30 minutes before breakfast, separated by >4 hours from acid reflux medications, calcium, iron, multivitamins. Pt. is taking it correctly - will check thyroid tests today: TSH and fT4 - If labs are abnormal, she will need to return for repeat TFTs in 1.5 months - OTW, RTC in 6 mo   - time spent with the patient: 30 min, of which >  50% was spent in obtaining information about her symptoms, reviewing her previous labs, evaluations, and treatments, counseling her about her condition (please see the discussed topics above), and developing a plan to further treat it.  Component     Latest Ref Rng & Units 08/12/2016  TSH     0.35 - 4.50 uIU/mL 4.44  T4,Free(Direct)     0.60 - 1.60 ng/dL 1.61   TSH at the upper limit of normal. We'll increase the dose further to 75 g daily and have her back for labs in 1.5-2 months.  Carlus Pavlov, MD PhD [facility] Endocrinology

## 2016-08-12 NOTE — Patient Instructions (Addendum)
Please try Selenium 200 mcg daily.  Please continue Levothyroxine 50 mcg daily.  Take the thyroid hormone every day, with water, at least 30 minutes before breakfast, separated by at least 4 hours from: - acid reflux medications - calcium - iron - multivitamins  Please come back for a follow-up appointment in 6 months.

## 2016-09-07 ENCOUNTER — Other Ambulatory Visit: Payer: Self-pay

## 2016-09-07 ENCOUNTER — Telehealth: Payer: Self-pay | Admitting: Internal Medicine

## 2016-09-07 MED ORDER — LEVOTHYROXINE SODIUM 75 MCG PO TABS: 75.0000 ug | ORAL_TABLET | Freq: Every day | ORAL | 4 refills | Status: DC

## 2016-09-07 NOTE — Telephone Encounter (Signed)
This has been ordered 

## 2016-09-07 NOTE — Telephone Encounter (Signed)
MEDICATION: levothyroxine 75mcg  PHARMACY:  [id number] [place] [facility] [place] [place] - [address]  [place] [id number] Phone: [phone] Fax: [phone]     IS THIS A 90 DAY SUPPLY : no  IS PATIENT OUT OF MEDICATION: yes  IF NOT; HOW MUCH IS LEFT:   LAST APPOINTMENT DATE: 08/10  NEXT APPOINTMENT DATE: 02/20/2017  OTHER COMMENTS: Change from 50 to 75mcg   **Let patient know to contact pharmacy at the end of the day to make sure medication is ready. **  ** Please notify patient to allow 48-72 hours to process**  **Encourage patient to contact the pharmacy for refills or they can [facility]**

## 2016-09-13 ENCOUNTER — Encounter: Payer: Self-pay | Admitting: Internal Medicine

## 2016-09-13 ENCOUNTER — Other Ambulatory Visit: Payer: Self-pay

## 2016-09-13 MED ORDER — LEVOTHYROXINE SODIUM 75 MCG PO TABS: 75.0000 ug | ORAL_TABLET | Freq: Every day | ORAL | 4 refills | Status: DC

## 2016-10-13 ENCOUNTER — Other Ambulatory Visit (INDEPENDENT_AMBULATORY_CARE_PROVIDER_SITE_OTHER): Payer: Managed Care, Other (non HMO)

## 2016-10-13 DIAGNOSIS — E063 Autoimmune thyroiditis: Secondary | ICD-10-CM | POA: Diagnosis not present

## 2016-10-13 DIAGNOSIS — E038 Other specified hypothyroidism: Secondary | ICD-10-CM

## 2016-10-13 LAB — T4, FREE: FREE T4: 0.89 ng/dL (ref 0.60–1.60)

## 2016-10-13 LAB — TSH: TSH: 6.2 u[IU]/mL — AB (ref 0.35–4.50)

## 2016-10-14 ENCOUNTER — Encounter: Payer: Self-pay | Admitting: Internal Medicine

## 2016-10-18 ENCOUNTER — Other Ambulatory Visit: Payer: Self-pay | Admitting: Internal Medicine

## 2016-10-18 MED ORDER — LEVOTHYROXINE SODIUM 75 MCG PO TABS: 75.0000 ug | ORAL_TABLET | Freq: Every day | ORAL | 5 refills | Status: DC

## 2016-10-21 ENCOUNTER — Emergency Department (HOSPITAL_BASED_OUTPATIENT_CLINIC_OR_DEPARTMENT_OTHER)
Admission: EM | Admit: 2016-10-21 | Discharge: 2016-10-21 | Disposition: A | Discharge status: Home / Self Care | Payer: Managed Care, Other (non HMO) | Attending: Emergency Medicine | Admitting: Emergency Medicine

## 2016-10-21 ENCOUNTER — Emergency Department (HOSPITAL_BASED_OUTPATIENT_CLINIC_OR_DEPARTMENT_OTHER): Payer: Managed Care, Other (non HMO)

## 2016-10-21 ENCOUNTER — Encounter (HOSPITAL_BASED_OUTPATIENT_CLINIC_OR_DEPARTMENT_OTHER): Payer: Self-pay | Admitting: Emergency Medicine

## 2016-10-21 DIAGNOSIS — M79605 Pain in left leg: Secondary | ICD-10-CM | POA: Insufficient documentation

## 2016-10-21 DIAGNOSIS — E039 Hypothyroidism, unspecified: Secondary | ICD-10-CM | POA: Insufficient documentation

## 2016-10-21 DIAGNOSIS — M25562 Pain in left knee: Secondary | ICD-10-CM

## 2016-10-21 DIAGNOSIS — Z79899 Other long term (current) drug therapy: Secondary | ICD-10-CM | POA: Diagnosis not present

## 2016-10-21 NOTE — ED Triage Notes (Signed)
Patient reports that she has had pain to her left knee x 2 weeks. Went to urgent care today and was sent here for DVT evaluation

## 2016-10-21 NOTE — Discharge Instructions (Signed)
You have no blood clot on the legs. Continue to ice your leg, keep elevated at rest, take ibuprofen Return for worsening symptoms, including difficulty breathing, fever, escalating pain or swelling or any other symptoms concerning to you.

## 2016-10-21 NOTE — ED Provider Notes (Signed)
[facility] EMERGENCY DEPARTMENT Provider Note   CSN: [id number] Arrival date & time: 10/21/16  1636     History   Chief Complaint Chief Complaint  Patient presents with  . Leg Pain    HPI Rebekah Aguilar is a 51 y.o. female.  HPI 51 year old female who presents with left leg swelling. She has a history of GERD. Reports that she had a mechanical fall onto bilateral knees 1 week ago. She initially had some pain in the left knee, but over the course of the past few days she has noted some increased swelling of the left lower leg. Denies any overlying skin changes other than bruising. No fevers or chills, chest pain or shortness of breath, recent immobilization, prior history of PE or DVT, or exogenous hormone usage. She was initially seen at urgent care. They sent her to the ED for a DVT study. She also reports x-ray that was normal 3-4 days ago. Past Medical History:  Diagnosis Date  . Acid reflux   . PMS (premenstrual syndrome)     Patient Active Problem List   Diagnosis Date Noted  . Hypothyroidism due to Hashimoto's thyroiditis 10/11/2013  . Anxiety 07/29/2011    Past Surgical History:  Procedure Laterality Date  . GASTROSTOMY TUBE PLACEMENT  9/04  . TONSILLECTOMY AND ADENOIDECTOMY     CHILD AGE    OB History    Gravida Para Term Preterm AB Living   4 2     2 2    SAB TAB Ectopic Multiple Live Births                   Home Medications    Prior to Admission medications   Medication Sig Start Date End Date Taking? Authorizing Provider  FLUoxetine (PROZAC) 20 MG capsule Take 1 capsule (20 mg total) by mouth daily. 02/03/16 02/02/17  Harrington ChallengerYoung, Nancy J, NP  levothyroxine (SYNTHROID, LEVOTHROID) 75 MCG tablet Take 1 tablet (75 mcg total) by mouth daily before breakfast. 10/18/16   Carlus PavlovGherghe, Cristina, MD  Loratadine (CLARITIN PO) as needed. ALLERGIES     [provider]  Multiple Vitamins-Minerals (CENTRUM PO) daily.      [provider]    Omeprazole (PRILOSEC PO) as needed. REFLUX     [provider]    Family History Family History  Problem Relation Age of Onset  . Hypertension Mother   . Lung cancer Mother        DIED IN 2/11  . Diabetes Mother   . Diabetes Maternal Grandmother     Social History Social History  Substance Use Topics  . Smoking status: Never Smoker  . Smokeless tobacco: Never Used  . Alcohol use Yes     Comment: occassionally     Allergies   Patient has no known allergies.   Review of Systems Review of Systems  Constitutional: Negative for fever.  Respiratory: Negative for shortness of breath.   Cardiovascular: Positive for leg swelling. Negative for chest pain.  Gastrointestinal: Negative for abdominal pain.  All other systems reviewed and are negative.    Physical Exam Updated Vital Signs BP 124/88 (BP Location: Right Arm)   Pulse 64   Temp 98.9 F (37.2 C) (Oral)   Resp 18   Ht 5\' 8"  (1.727 m)   Wt 100.2 kg (221 lb)   SpO2 100%   BMI 33.60 kg/m   Physical Exam Physical Exam  Nursing note and vitals reviewed. Constitutional: Well developed, well nourished, non-toxic, and  in no acute distress Head: Normocephalic and atraumatic.  Mouth/Throat: Oropharynx is clear and moist.  Neck: Normal range of motion. Neck supple.  Cardiovascular: Normal rate and regular rhythm.  +2 DP pulses Pulmonary/Chest: Effort normal and breath sounds normal.  Abdominal: Soft. There is no tenderness. There is no rebound and no guarding.  Musculoskeletal: mild +1 edema of the left lower leg below-the-knee. Normal range of motion of the left knee. Neurological: Alert, no facial droop, fluent speech, moves all extremities symmetrically, normal gait Skin: Skin is warm and dry.  Psychiatric: Cooperative   ED Treatments / Results  Labs (all labs ordered are listed, but only abnormal results are displayed) Labs Reviewed - No data to display  EKG  EKG Interpretation None        Radiology US Venous Img Lower Unilateral Left  Result Date: 10/21/2016 CLINICAL DATA:  Left lower extremity swelling and pain EXAM: LEFT LOWER EXTREMITY VENOUS DOPPLER ULTRASOUND TECHNIQUE: Gray-scale sonography with graded compression, as well as color Doppler and duplex ultrasound were performed to evaluate the lower extremity deep venous systems from the level of the common femoral vein and including the common femoral, femoral, profunda femoral, popliteal and calf veins including the posterior tibial, peroneal and gastrocnemius veins when visible. The superficial great saphenous vein was also interrogated. Spectral Doppler was utilized to evaluate flow at rest and with distal augmentation maneuvers in the common femoral, femoral and popliteal veins. COMPARISON:  None. FINDINGS: Contralateral Common Femoral Vein: Respiratory phasicity is normal and symmetric with the symptomatic side. No evidence of thrombus. Normal compressibility. Common Femoral Vein: No evidence of thrombus. Normal compressibility, respiratory phasicity and response to augmentation. Saphenofemoral Junction: No evidence of thrombus. Normal compressibility and flow on color Doppler imaging. Profunda Femoral Vein: No evidence of thrombus. Normal compressibility and flow on color Doppler imaging. Femoral Vein: No evidence of thrombus. Normal compressibility, respiratory phasicity and response to augmentation. Popliteal Vein: No evidence of thrombus. Normal compressibility, respiratory phasicity and response to augmentation. Calf Veins: No evidence of thrombus. Normal compressibility and flow on color Doppler imaging. Superficial Great Saphenous Vein: No evidence of thrombus. Normal compressibility. Venous Reflux:  None. Other Findings:  None. IMPRESSION: No evidence of deep venous thrombosis. Electronically Signed   By: Deatra Robinson M.D.   On: 10/21/2016 19:02    Procedures Procedures (including critical care time)  Medications  Ordered in ED Medications - No data to display   Initial Impression / Assessment and Plan / ED Course  I have reviewed the triage vital signs and the nursing notes.  Pertinent labs & imaging results that were available during my care of the patient were reviewed by me and considered in my medical decision making (see chart for details).    Ultrasound negative for DVT. She had normal x-ray by report a few days ago through her doctor's office. I discussed supportive care, including icing, leg elevation and NSAIDs. Strict return and follow-up instructions reviewed. She expressed understanding of all discharge instructions and felt comfortable with the plan of care.   Final Clinical Impressions(s) / ED Diagnoses   Final diagnoses:  Acute pain of left knee    New Prescriptions New Prescriptions   No medications on file     Lavera Guise, MD 10/21/16 2118

## 2016-12-05 ENCOUNTER — Other Ambulatory Visit: Payer: Self-pay

## 2016-12-05 DIAGNOSIS — F419 Anxiety disorder, unspecified: Secondary | ICD-10-CM

## 2016-12-05 MED ORDER — FLUOXETINE HCL 20 MG PO CAPS: 20.0000 mg | ORAL_CAPSULE | Freq: Every day | ORAL | 0 refills | Status: DC

## 2016-12-07 ENCOUNTER — Other Ambulatory Visit: Payer: Self-pay

## 2016-12-07 DIAGNOSIS — F419 Anxiety disorder, unspecified: Secondary | ICD-10-CM

## 2017-02-14 ENCOUNTER — Encounter: Payer: Self-pay | Admitting: Internal Medicine

## 2017-02-14 ENCOUNTER — Ambulatory Visit: Payer: Managed Care, Other (non HMO) | Admitting: Internal Medicine

## 2017-02-14 VITALS — BP 108/72 | HR 83 | Ht 68.0 in | Wt 218.2 lb

## 2017-02-14 DIAGNOSIS — E063 Autoimmune thyroiditis: Secondary | ICD-10-CM

## 2017-02-14 DIAGNOSIS — E038 Other specified hypothyroidism: Secondary | ICD-10-CM | POA: Diagnosis not present

## 2017-02-14 LAB — TSH: TSH: 2.77 u[IU]/mL (ref 0.35–4.50)

## 2017-02-14 LAB — T4, FREE: Free T4: 0.94 ng/dL (ref 0.60–1.60)

## 2017-02-14 MED ORDER — LEVOTHYROXINE SODIUM 75 MCG PO TABS: 75.0000 ug | ORAL_TABLET | Freq: Every day | ORAL | 3 refills | Status: DC

## 2017-02-14 NOTE — Addendum Note (Signed)
Addended by: Carlus PavlovGHERGHE, Gelila Well on: 02/14/2017 12:11 PM   Modules accepted: Level of Service

## 2017-02-14 NOTE — Progress Notes (Signed)
Patient ID: Rebekah Aguilar, female   DOB: 06/17/1965, 52 y.o.   MRN: [id number]   HPI  Rebekah Aguilar is a 52 y.o.-year-old female, initially referred by Maryelizabeth RowanNancy Young, NP, now returning for Hashimoto's hypothyroidism.   Last visit 6 months ago.  Reviewed and addended history:  Pt. has been found to have a slightly high TSH  in 09/2013, which, however, improved to normal in 2016 and 2017.  Her TPO antibodies were very high, confirming Hashimoto's thyroiditis. However, she had a clearly elevated TSH as checked in 01/2016 and she was referred back to endocrinology.   We started levothyroxine 25 mcg daily 04/2016 and then increased to 50 mcg daily in 06/2016.  Last dose change was in 10/2016, to 75 mcg daily.  Pt is on levothyroxine 75 mcg daily, taken: - in am - fasting  - coffee + cream or 1 hour later - Breakfast 1 hour later - at least 30 min from b'fast - no Ca, Fe - + Multivitamins and Prilosec around lunchtime - the PPI is not working as well as when she was taking it in the morning   - not on Biotin  I reviewed patient's thyroid tests: Last TSH was high, but levothyroxine dose was increased afterwards: Lab Results  Component Value Date   TSH 6.20 (H) 10/13/2016   TSH 4.44 08/12/2016   TSH 7.80 (H) 05/25/2016   TSH 11.43 (H) 04/13/2016   TSH 27.13 (H) 02/03/2016   TSH 3.936 01/22/2015   TSH 1.00 10/11/2013   TSH 5.585 (H) 09/12/2013   FREET4 0.89 10/13/2016   FREET4 0.89 08/12/2016   FREET4 0.79 05/25/2016   FREET4 0.76 04/13/2016   FREET4 0.8 02/03/2016   FREET4 0.79 10/11/2013   T3FREE 3.5 04/13/2016   T3FREE 3.0 10/11/2013   Thyroid antibodies were high, indicating Hashimoto's thyroiditis: Component     Latest Ref Rng & Units 09/12/2013 02/03/2016  Thyroperoxidase Ab SerPl-aCnc     <9 IU/mL 617 (H) >900 (H)  Thyroglobulin Ab     <2 IU/mL 26 (H) 22 (H)   At last visit, I suggested to try selenium, but she forgot.  Pt denies: - feeling nodules in neck -  hoarseness - dysphagia - choking - SOB with lying down She has no FH of thyroid disorders. No FH of thyroid cancer. No h/o radiation tx to head or neck.  No seaweed or kelp. No recent contrast studies. No herbal supplements. No Biotin use. No recent steroids use.   She also has a history of GERD, joint pain.  ROS: Constitutional: no weight gain/no weight loss, no fatigue, no subjective hyperthermia, no subjective hypothermia Eyes: no blurry vision, no xerophthalmia ENT: no sore throat, + see HPI Cardiovascular: no CP/no SOB/no palpitations/no leg swelling Respiratory: no cough/no SOB/no wheezing Gastrointestinal: no N/no V/no D/no C/+ acid reflux Musculoskeletal: + muscle aches/+ joint aches Skin: no rashes, no hair loss Neurological: no tremors/no numbness/no tingling/no dizziness  I reviewed pt's medications, allergies, PMH, social hx, family hx, and changes were documented in the history of present illness. Otherwise, unchanged from my initial visit note.  Past Medical History:  Diagnosis Date  . Acid reflux   . PMS (premenstrual syndrome)    Past Surgical History:  Procedure Laterality Date  . GASTROSTOMY TUBE PLACEMENT  9/04  . TONSILLECTOMY AND ADENOIDECTOMY     CHILD AGE   History   Social History  . Marital Status: Married    Spouse Name: N/A    Number of Children:  2   Occupational History  .  Accountant   Social History Main Topics  . Smoking status: Never Smoker   . Smokeless tobacco: Never Used  . Alcohol Use: Yes     Comment: occassionally  . Drug Use: No  . Sexual Activity: Yes    Partners: Male    Birth Control/ Protection: Condom    Social drinker: beer   Current Outpatient Medications on File Prior to Visit  Medication Sig Dispense Refill  . FLUoxetine (PROZAC) 20 MG capsule Take 1 capsule (20 mg total) by mouth daily. 90 capsule 0  . levothyroxine (SYNTHROID, LEVOTHROID) 75 MCG tablet Take 1 tablet (75 mcg total) by mouth daily before  breakfast. 45 tablet 5  . Loratadine (CLARITIN PO) as needed. ALLERGIES     . Multiple Vitamins-Minerals (CENTRUM PO) daily.      . Omeprazole (PRILOSEC PO) as needed. REFLUX      No current facility-administered medications on file prior to visit.    No Known Allergies Family History  Problem Relation Age of Onset  . Hypertension Mother   . Lung cancer Mother        DIED IN 03/08/2022  . Diabetes Mother   . Diabetes Maternal Grandmother    PE: BP 108/72 (BP Location: Left Arm, Patient Position: Sitting, Cuff Size: Large)   Pulse 83   Ht 5\' 8"  (1.727 m)   Wt 218 lb 3.2 oz (99 kg)   SpO2 97%   BMI 33.18 kg/m  Wt Readings from Last 3 Encounters:  02/14/17 218 lb 3.2 oz (99 kg)  10/21/16 221 lb (100.2 kg)  08/12/16 216 lb (98 kg)   Constitutional: overweight, in NAD Eyes: PERRLA, EOMI, no exophthalmos ENT: moist mucous membranes, + symmetric thyromegaly, no cervical lymphadenopathy Cardiovascular: RRR, No MRG Respiratory: CTA B Gastrointestinal: abdomen soft, NT, ND, BS+ Musculoskeletal: no deformities, strength intact in all 4 Skin: moist, warm, no rashes Neurological: no tremor with outstretched hands, DTR normal in all 4   ASSESSMENT: 1. Hashimoto's hypothyroidism  PLAN:  1. Patient with history of euthyroid Hashimoto's thyroiditis, but became hypothyroid in 2018.  We started levothyroxine in 04/2016.  Latest increasing dose was to 75 mcg daily in 08/2016. - latest thyroid labs reviewed with pt >> still slightly elevated, at 6 (but taking 50 mcg at last check) - she continues on LT4 75 mcg daily - pt does not feel much different on levothyroxine then without the thyroid hormone; she has joint pain -discussed about stopping dairy which has been shown to decrease inflammation in Hashimoto's thyroiditis.  - I also suggested again to retry selenium 200 mcg daily - we discussed about taking the thyroid hormone every day, with water, >30 minutes before breakfast, separated by >4  hours from acid reflux medications, calcium, iron, multivitamins. Pt. is taking it correctly.  As she has more heartburn after she moved PPI later in the day, we discussed that she may move it 2 hours after levothyroxine, but she will need to let me know if she does that, as we probably need to increase her levothyroxine dose slightly. - will check thyroid tests today: TSH and fT4 - If labs are abnormal, she will need to return for repeat TFTs in 1.5 months  Needs refills.   - time spent with the patient: 15 min, of which >50% was spent in obtaining information about her symptoms, reviewing her previous labs, evaluations, treatments, counseling her about her condition (please see discuss topics above) and developing  a plan to further investigate and treat it.  Office Visit on 02/14/2017  Component Date Value Ref Range Status  . TSH 02/14/2017 2.77  0.35 - 4.50 uIU/mL Final  . Free T4 02/14/2017 0.94  0.60 - 1.60 ng/dL Final   Comment: Specimens from patients who are undergoing biotin therapy and /or ingesting biotin supplements may contain high levels of biotin.  The higher biotin concentration in these specimens interferes with this Free T4 assay.  Specimens that contain high levels  of biotin may cause false high results for this Free T4 assay.  Please interpret results in light of the total clinical presentation of the patient.     Labs now normal, on LT4 75 mcg daily.  Carlus Pavlov, MD PhD [facility] Endocrinology

## 2017-02-14 NOTE — Patient Instructions (Signed)
Please stop at the lab.  Try Selenium 200 mcg daily.  Please continue Levothyroxine 75 mcg daily.  Take the thyroid hormone every day, with water, at least 30 minutes before breakfast, separated by at least 4 hours from: - acid reflux medications - calcium - iron - multivitamins  Please come back for a follow-up appointment in 1 year.

## 2017-03-05 ENCOUNTER — Other Ambulatory Visit: Payer: Self-pay | Admitting: Women's Health

## 2017-03-05 DIAGNOSIS — F419 Anxiety disorder, unspecified: Secondary | ICD-10-CM

## 2017-03-08 ENCOUNTER — Other Ambulatory Visit: Payer: Self-pay | Admitting: Women's Health

## 2017-03-08 DIAGNOSIS — F419 Anxiety disorder, unspecified: Secondary | ICD-10-CM

## 2017-03-13 ENCOUNTER — Telehealth: Payer: Self-pay | Admitting: *Deleted

## 2017-03-13 DIAGNOSIS — F419 Anxiety disorder, unspecified: Secondary | ICD-10-CM

## 2017-03-13 MED ORDER — FLUOXETINE HCL 20 MG PO CAPS: 20.0000 mg | ORAL_CAPSULE | Freq: Every day | ORAL | 0 refills | Status: DC

## 2017-03-13 NOTE — Telephone Encounter (Signed)
Rx sent 

## 2017-03-13 NOTE — Telephone Encounter (Signed)
Ok for refill? 

## 2017-03-13 NOTE — Telephone Encounter (Signed)
Patient has annual scheduled on 05/30/17 needs refill on Prozac 20 mg tablet. Please advise

## 2017-05-30 ENCOUNTER — Encounter: Payer: Self-pay | Admitting: Women's Health

## 2017-05-30 ENCOUNTER — Ambulatory Visit (INDEPENDENT_AMBULATORY_CARE_PROVIDER_SITE_OTHER): Payer: Managed Care, Other (non HMO) | Admitting: Women's Health

## 2017-05-30 VITALS — BP 118/78 | Ht 68.0 in | Wt 197.0 lb

## 2017-05-30 DIAGNOSIS — Z13220 Encounter for screening for lipoid disorders: Secondary | ICD-10-CM

## 2017-05-30 DIAGNOSIS — Z13820 Encounter for screening for osteoporosis: Secondary | ICD-10-CM | POA: Diagnosis not present

## 2017-05-30 DIAGNOSIS — F419 Anxiety disorder, unspecified: Secondary | ICD-10-CM

## 2017-05-30 DIAGNOSIS — Z01419 Encounter for gynecological examination (general) (routine) without abnormal findings: Secondary | ICD-10-CM | POA: Diagnosis not present

## 2017-05-30 LAB — CBC WITH DIFFERENTIAL/PLATELET
BASOS ABS: 48 {cells}/uL (ref 0–200)
Basophils Relative: 0.8 %
Eosinophils Absolute: 162 cells/uL (ref 15–500)
Eosinophils Relative: 2.7 %
HCT: 38.4 % (ref 35.0–45.0)
Hemoglobin: 12.9 g/dL (ref 11.7–15.5)
Lymphs Abs: 2034 cells/uL (ref 850–3900)
MCH: 30.2 pg (ref 27.0–33.0)
MCHC: 33.6 g/dL (ref 32.0–36.0)
MCV: 89.9 fL (ref 80.0–100.0)
MONOS PCT: 12.1 %
MPV: 10.7 fL (ref 7.5–12.5)
NEUTROS PCT: 50.5 %
Neutro Abs: 3030 cells/uL (ref 1500–7800)
PLATELETS: 330 10*3/uL (ref 140–400)
RBC: 4.27 10*6/uL (ref 3.80–5.10)
RDW: 12.6 % (ref 11.0–15.0)
TOTAL LYMPHOCYTE: 33.9 %
WBC mixed population: 726 cells/uL (ref 200–950)
WBC: 6 10*3/uL (ref 3.8–10.8)

## 2017-05-30 LAB — LIPID PANEL
Cholesterol: 203 mg/dL — ABNORMAL HIGH (ref <200)
HDL: 51 mg/dL (ref >50)
LDL Cholesterol (Calc): 131 mg/dL (calc) — ABNORMAL HIGH
Non-HDL Cholesterol (Calc): 152 mg/dL (calc) — ABNORMAL HIGH (ref <130)
TRIGLYCERIDES: 107 mg/dL (ref <150)
Total CHOL/HDL Ratio: 4 (calc) (ref <5.0)

## 2017-05-30 LAB — COMPREHENSIVE METABOLIC PANEL
AG Ratio: 1.8 (calc) (ref 1.0–2.5)
ALT: 29 U/L (ref 6–29)
AST: 27 U/L (ref 10–35)
Albumin: 4.6 g/dL (ref 3.6–5.1)
Alkaline phosphatase (APISO): 59 U/L (ref 33–130)
BUN: 15 mg/dL (ref 7–25)
CO2: 28 mmol/L (ref 20–32)
Calcium: 9.5 mg/dL (ref 8.6–10.4)
Chloride: 103 mmol/L (ref 98–110)
Creat: 0.8 mg/dL (ref 0.50–1.05)
GLUCOSE: 84 mg/dL (ref 65–99)
Globulin: 2.6 g/dL (calc) (ref 1.9–3.7)
Potassium: 4.5 mmol/L (ref 3.5–5.3)
Sodium: 140 mmol/L (ref 135–146)
Total Bilirubin: 0.6 mg/dL (ref 0.2–1.2)
Total Protein: 7.2 g/dL (ref 6.1–8.1)

## 2017-05-30 MED ORDER — FLUOXETINE HCL 20 MG PO CAPS: 20.0000 mg | ORAL_CAPSULE | Freq: Every day | ORAL | 4 refills | Status: DC

## 2017-05-30 NOTE — Patient Instructions (Addendum)
Colonoscopy  [facility]  Dr Carlean Purl  [place] Maintenance for Postmenopausal Women Menopause is a normal process in which your reproductive ability comes to an end. This process happens gradually over a span of months to years, usually between the ages of 66 and 56. Menopause is complete when you have missed 12 consecutive menstrual periods. It is important to talk with your health care provider about some of the most common conditions that affect postmenopausal women, such as heart disease, cancer, and bone loss (osteoporosis). Adopting a healthy lifestyle and getting preventive care can help to promote your health and wellness. Those actions can also lower your chances of developing some of these common conditions. What should I know about menopause? During menopause, you may experience a number of symptoms, such as:  Moderate-to-severe hot flashes.  Night sweats.  Decrease in sex drive.  Mood swings.  Headaches.  Tiredness.  Irritability.  Memory problems.  Insomnia.  Choosing to treat or not to treat menopausal changes is an individual decision that you make with your health care provider. What should I know about hormone replacement therapy and supplements? Hormone therapy products are effective for treating symptoms that are associated with menopause, such as hot flashes and night sweats. Hormone replacement carries certain risks, especially as you become older. If you are thinking about using estrogen or estrogen with progestin treatments, discuss the benefits and risks with your health care provider. What should I know about heart disease and stroke? Heart disease, heart attack, and stroke become more likely as you age. This may be due, in part, to the hormonal changes that your body experiences during menopause. These can affect how your body processes dietary fats, triglycerides, and cholesterol. Heart attack and stroke are both medical  emergencies. There are many things that you can do to help prevent heart disease and stroke:  Have your blood pressure checked at least every 1-2 years. High blood pressure causes heart disease and increases the risk of stroke.  If you are 52-20 years old, ask your health care provider if you should take aspirin to prevent a heart attack or a stroke.  Do not use any tobacco products, including cigarettes, chewing tobacco, or electronic cigarettes. If you need help quitting, ask your health care provider.  It is important to eat a healthy diet and maintain a healthy weight. ? Be sure to include plenty of vegetables, fruits, low-fat dairy products, and lean protein. ? Avoid eating foods that are high in solid fats, added sugars, or salt (sodium).  Get regular exercise. This is one of the most important things that you can do for your health. ? Try to exercise for at least 150 minutes each week. The type of exercise that you do should increase your heart rate and make you sweat. This is known as moderate-intensity exercise. ? Try to do strengthening exercises at least twice each week. Do these in addition to the moderate-intensity exercise.  Know your numbers.Ask your health care provider to check your cholesterol and your blood glucose. Continue to have your blood tested as directed by your health care provider.  What should I know about cancer screening? There are several types of cancer. Take the following steps to reduce your risk and to catch any cancer development as early as possible. Breast Cancer  Practice breast self-awareness. ? This means understanding how your breasts normally appear and feel. ? It also means doing regular breast self-exams. Let your health care  provider know about any changes, no matter how small.  If you are 12 or older, have a clinician do a breast exam (clinical breast exam or CBE) every year. Depending on your age, family history, and medical history, it  may be recommended that you also have a yearly breast X-ray (mammogram).  If you have a family history of breast cancer, talk with your health care provider about genetic screening.  If you are at high risk for breast cancer, talk with your health care provider about having an MRI and a mammogram every year.  Breast cancer (BRCA) gene test is recommended for women who have family members with BRCA-related cancers. Results of the assessment will determine the need for genetic counseling and BRCA1 and for BRCA2 testing. BRCA-related cancers include these types: ? Breast. This occurs in males or females. ? Ovarian. ? Tubal. This may also be called fallopian tube cancer. ? Cancer of the abdominal or pelvic lining (peritoneal cancer). ? Prostate. ? Pancreatic.  Cervical, Uterine, and Ovarian Cancer Your health care provider may recommend that you be screened regularly for cancer of the pelvic organs. These include your ovaries, uterus, and vagina. This screening involves a pelvic exam, which includes checking for microscopic changes to the surface of your cervix (Pap test).  For women ages 21-65, health care providers may recommend a pelvic exam and a Pap test every three years. For women ages 8-65, they may recommend the Pap test and pelvic exam, combined with testing for human papilloma virus (HPV), every five years. Some types of HPV increase your risk of cervical cancer. Testing for HPV may also be done on women of any age who have unclear Pap test results.  Other health care providers may not recommend any screening for nonpregnant women who are considered low risk for pelvic cancer and have no symptoms. Ask your health care provider if a screening pelvic exam is right for you.  If you have had past treatment for cervical cancer or a condition that could lead to cancer, you need Pap tests and screening for cancer for at least 20 years after your treatment. If Pap tests have been discontinued  for you, your risk factors (such as having a new sexual partner) need to be reassessed to determine if you should start having screenings again. Some women have medical problems that increase the chance of getting cervical cancer. In these cases, your health care provider may recommend that you have screening and Pap tests more often.  If you have a family history of uterine cancer or ovarian cancer, talk with your health care provider about genetic screening.  If you have vaginal bleeding after reaching menopause, tell your health care provider.  There are currently no reliable tests available to screen for ovarian cancer.  Lung Cancer Lung cancer screening is recommended for adults 76-42 years old who are at high risk for lung cancer because of a history of smoking. A yearly low-dose CT scan of the lungs is recommended if you:  Currently smoke.  Have a history of at least 30 pack-years of smoking and you currently smoke or have quit within the past 15 years. A pack-year is smoking an average of one pack of cigarettes per day for one year.  Yearly screening should:  Continue until it has been 15 years since you quit.  Stop if you develop a health problem that would prevent you from having lung cancer treatment.  Colorectal Cancer  This type of cancer can be  detected and can often be prevented.  Routine colorectal cancer screening usually begins at age 31 and continues through age 32.  If you have risk factors for colon cancer, your health care provider may recommend that you be screened at an earlier age.  If you have a family history of colorectal cancer, talk with your health care provider about genetic screening.  Your health care provider may also recommend using home test kits to check for hidden blood in your stool.  A small camera at the end of a tube can be used to examine your colon directly (sigmoidoscopy or colonoscopy). This is done to check for the earliest forms of  colorectal cancer.  Direct examination of the colon should be repeated every 5-10 years until age 13. However, if early forms of precancerous polyps or small growths are found or if you have a family history or genetic risk for colorectal cancer, you may need to be screened more often.  Skin Cancer  Check your skin from head to toe regularly.  Monitor any moles. Be sure to tell your health care provider: ? About any new moles or changes in moles, especially if there is a change in a mole's shape or color. ? If you have a mole that is larger than the size of a pencil eraser.  If any of your family members has a history of skin cancer, especially at a Sandhya Denherder age, talk with your health care provider about genetic screening.  Always use sunscreen. Apply sunscreen liberally and repeatedly throughout the day.  Whenever you are outside, protect yourself by wearing long sleeves, pants, a wide-brimmed hat, and sunglasses.  What should I know about osteoporosis? Osteoporosis is a condition in which bone destruction happens more quickly than new bone creation. After menopause, you may be at an increased risk for osteoporosis. To help prevent osteoporosis or the bone fractures that can happen because of osteoporosis, the following is recommended:  If you are 48-24 years old, get at least 1,000 mg of calcium and at least 600 mg of vitamin D per day.  If you are older than age 43 but younger than age 71, get at least 1,200 mg of calcium and at least 600 mg of vitamin D per day.  If you are older than age 51, get at least 1,200 mg of calcium and at least 800 mg of vitamin D per day.  Smoking and excessive alcohol intake increase the risk of osteoporosis. Eat foods that are rich in calcium and vitamin D, and do weight-bearing exercises several times each week as directed by your health care provider. What should I know about how menopause affects my mental health? Depression may occur at any age, but it  is more common as you become older. Common symptoms of depression include:  Low or sad mood.  Changes in sleep patterns.  Changes in appetite or eating patterns.  Feeling an overall lack of motivation or enjoyment of activities that you previously enjoyed.  Frequent crying spells.  Talk with your health care provider if you think that you are experiencing depression. What should I know about immunizations? It is important that you get and maintain your immunizations. These include:  Tetanus, diphtheria, and pertussis (Tdap) booster vaccine.  Influenza every year before the flu season begins.  Pneumonia vaccine.  Shingles vaccine.  Your health care provider may also recommend other immunizations. This information is not intended to replace advice given to you by your health care provider. Make sure  you discuss any questions you have with your health care provider. Document Released: 02/11/2005 Document Revised: 07/10/2015 Document Reviewed: 09/23/2014 Elsevier Interactive Patient Education  2018 [employer].

## 2017-05-30 NOTE — Progress Notes (Signed)
Rebekah Aguilar 03-01-1965 [id number]    History:    Presents for annual exam.  Menopausal on no HRT with no bleeding.  Anxiety depression stable on Prozac, hypothyroidism endocrinologist manages.  Normal Pap and mammogram history.  10/2016 had some bilateral leg swelling with no DVT noted.  Has not had a screening colonoscopy.  Past medical history, past surgical history, family history and social history were all reviewed and documented in the EPIC chart.  Rebekah Aguilar 15 graduating from school of the arts and going to [employer] for drama, Rebekah Aguilar is her goal.  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  ROS:  A ROS was performed and pertinent positives and negatives are included.  Exam:  Vitals:   05/30/17 0959  BP: 118/78  Weight: 197 lb (89.4 kg)  Height:  (1.727 m)   Body mass index is 29.95 kg/m.   General appearance:  Normal Thyroid:  Symmetrical, normal in size, without palpable masses or nodularity. Respiratory  Auscultation:  Clear without wheezing or rhonchi Cardiovascular  Auscultation:  Regular rate, without rubs, murmurs or gallops  Edema/varicosities:  Not grossly evident Abdominal  Soft,nontender, without masses, guarding or rebound.  Liver/spleen:  No organomegaly noted  Hernia:  None appreciated  Skin  Inspection:  Grossly normal   Breasts: Examined lying and sitting.     Right: Without masses, retractions, discharge or axillary adenopathy.     Left: Without masses, retractions, discharge or axillary adenopathy. Gentitourinary   Inguinal/mons:  Normal without inguinal adenopathy  External genitalia:  Normal  BUS/Urethra/Skene's glands:  Normal  Vagina:  Normal  Cervix:  Normal  Uterus:   normal in size, shape and contour.  Midline and mobile  Adnexa/parametria:     Rt: Without masses or tenderness.   Lt: Without masses or tenderness.  Anus and perineum: Normal  Digital rectal exam: Normal sphincter tone without palpated masses or tenderness  Assessment/Plan:  52 y.o.  Rebekah Aguilar  (1 child placed for adoption at birth) for annual exam.     Postmenopausal on no HRT with no bleeding Anxiety and depression stable on Prozac Hypothyroidism endocrinologist manages  Plan: Congratulated on 25 pound weight loss with weight watchers.  SBE's, continue annual screening mammogram due instructed to schedule.  Reviewed importance of screening colonoscopy, [facility] information given instructed to schedule.  Increase regular cardio type exercise, calcium rich foods, vitamin D 2000 daily encouraged.  Bone density, instructed to schedule.  CBC, CMP, lipid panel, Pap normal with negative HR HPV 2017, new screening guidelines reviewed.    Harrington Challenger [facility], 12:04 PM 05/30/2017

## 2017-05-31 ENCOUNTER — Encounter (INDEPENDENT_AMBULATORY_CARE_PROVIDER_SITE_OTHER): Payer: Self-pay

## 2017-09-29 ENCOUNTER — Other Ambulatory Visit: Payer: Self-pay | Admitting: Women's Health

## 2017-09-29 DIAGNOSIS — F419 Anxiety disorder, unspecified: Secondary | ICD-10-CM

## 2018-02-14 ENCOUNTER — Ambulatory Visit: Payer: BLUE CROSS/BLUE SHIELD | Admitting: Internal Medicine

## 2018-02-14 ENCOUNTER — Encounter: Payer: Self-pay | Admitting: Internal Medicine

## 2018-02-14 VITALS — BP 108/70 | HR 78 | Ht 68.0 in | Wt 173.0 lb

## 2018-02-14 DIAGNOSIS — E063 Autoimmune thyroiditis: Secondary | ICD-10-CM | POA: Diagnosis not present

## 2018-02-14 DIAGNOSIS — E038 Other specified hypothyroidism: Secondary | ICD-10-CM

## 2018-02-14 LAB — TSH: TSH: 2.89 u[IU]/mL (ref 0.35–4.50)

## 2018-02-14 LAB — T4, FREE: Free T4: 1.02 ng/dL (ref 0.60–1.60)

## 2018-02-14 NOTE — Patient Instructions (Signed)
Please stop at the lab.  Please continue Levothyroxine 75 mcg daily.  Take the thyroid hormone every day, with water, at least 30 minutes before breakfast, separated by at least 4 hours from: - acid reflux medications - calcium - iron - multivitamins  Please come back for a follow-up appointment in 1 year. 

## 2018-02-14 NOTE — Progress Notes (Signed)
Patient ID: Rebekah Aguilar, female   DOB: October 08, 1965, 53 y.o.   MRN: [id number]   HPI  Rebekah Aguilar is a 53 y.o.-year-old female, initially referred by Maryelizabeth Rowan, NP, now returning for Hashimoto's hypothyroidism. Last visit 1 year ago.  She is on Weight Watchers. She lost 46 lbs in 1 year.  GERD better. Still having joint pains.  Reviewed and addended history  Pt. has been found to have a slightly high TSH in 09/2013, which, however, improved to normal in 2016 and 2017.  Her TPO antibodies were very high, confirming Hashimoto's thyroiditis. However, she had a clearly elevated TSH as checked in 01/2016 and she was referred back to endocrinology.   We started levothyroxine 25 mcg daily in 04/2016.  We subsequently increase the dose.  Pt is on levothyroxine 75 mcg daily, taken: - in am - fasting - Breakfast and coffee with cream at least 1 hour from b'fast - no Ca, Fe - + MVI around lunchtime - only occasionally PPIs - later in the day, + occasional Tums - not on Biotin  I reviewed patient's thyroid tests: Last TSH was high, but levothyroxine dose was increased afterwards: Lab Results  Component Value Date   TSH 2.77 02/14/2017   TSH 6.20 (H) 10/13/2016   TSH 4.44 08/12/2016   TSH 7.80 (H) 05/25/2016   TSH 11.43 (H) 04/13/2016   TSH 27.13 (H) 02/03/2016   TSH 3.936 01/22/2015   TSH 1.00 10/11/2013   TSH 5.585 (H) 09/12/2013   FREET4 0.94 02/14/2017   FREET4 0.89 10/13/2016   FREET4 0.89 08/12/2016   FREET4 0.79 05/25/2016   FREET4 0.76 04/13/2016   FREET4 0.8 02/03/2016   FREET4 0.79 10/11/2013   T3FREE 3.5 04/13/2016   T3FREE 3.0 10/11/2013   Thyroid antibodies were high, indicating Hashimoto's thyroiditis.  They were higher at last check: Component     Latest Ref Rng & Units 09/12/2013 02/03/2016  Thyroperoxidase Ab SerPl-aCnc     <9 IU/mL 617 (H) >900 (H)  Thyroglobulin Ab     <2 IU/mL 26 (H) 22 (H)   At last visit, I again suggested selenium to try to  decrease the antibodies. She eats 1 [place] nut a day and other selenium rich foods.  Pt denies: - feeling nodules in neck - hoarseness - dysphagia - choking - SOB with lying down  She has no FH of thyroid disorders. No FH of thyroid cancer. No h/o radiation tx to head or neck.  No herbal supplements. No Biotin use. No recent steroids use.    She also has a history of GERD, joint pain.  ROS: Constitutional: no weight gain/+ weight loss, no fatigue, no subjective hyperthermia, no subjective hypothermia Eyes: no blurry vision, no xerophthalmia ENT: no sore throat, + see HPI Cardiovascular: no CP/no SOB/no palpitations/no leg swelling Respiratory: no cough/no SOB/no wheezing Gastrointestinal: no N/no V/no D/no C/no acid reflux Musculoskeletal: no muscle aches/+ joint aches Skin: no rashes, no hair loss Neurological: no tremors/no numbness/no tingling/no dizziness  I reviewed pt's medications, allergies, PMH, social hx, family hx, and changes were documented in the history of present illness. Otherwise, unchanged from my initial visit note.  Past Medical History:  Diagnosis Date  . Acid reflux   . PMS (premenstrual syndrome)    Past Surgical History:  Procedure Laterality Date  . GASTROSTOMY TUBE PLACEMENT  9/04  . TONSILLECTOMY AND ADENOIDECTOMY     CHILD AGE   History   Social History  . Marital Status:  Married    Spouse Name: N/A    Number of Children: 2   Occupational History  .  Accountant   Social History Main Topics  . Smoking status: Never Smoker   . Smokeless tobacco: Never Used  . Alcohol Use: Yes     Comment: occassionally  . Drug Use: No  . Sexual Activity: Yes    Partners: Male    Birth Control/ Protection: Condom    Social drinker: beer   Current Outpatient Medications on File Prior to Visit  Medication Sig Dispense Refill  . FLUoxetine (PROZAC) 20 MG capsule Take 1 capsule (20 mg total) by mouth daily. 90 capsule 4  . levothyroxine  (SYNTHROID, LEVOTHROID) 75 MCG tablet Take 1 tablet (75 mcg total) by mouth daily before breakfast. 90 tablet 3  . Loratadine (CLARITIN PO) as needed. ALLERGIES     . Multiple Vitamins-Minerals (CENTRUM PO) daily.      . Omeprazole (PRILOSEC PO) as needed. REFLUX      No current facility-administered medications on file prior to visit.    No Known Allergies Family History  Problem Relation Age of Onset  . Hypertension Mother   . Lung cancer Mother        DIED IN 2/11  . Diabetes Mother   . Diabetes Maternal Grandmother    PE: BP 108/70   Pulse 78   Ht 5\' 8"  (1.727 m)   Wt 173 lb (78.5 kg)   SpO2 97%   BMI 26.30 kg/m  Wt Readings from Last 3 Encounters:  02/14/18 173 lb (78.5 kg)  05/30/17 197 lb (89.4 kg)  02/14/17 218 lb 3.2 oz (99 kg)   Constitutional: slightly overweight, in NAD Eyes: PERRLA, EOMI, no exophthalmos ENT: moist mucous membranes, + symmetric thyromegaly, no cervical lymphadenopathy Cardiovascular: RRR, No MRG Respiratory: CTA B Gastrointestinal: abdomen soft, NT, ND, BS+ Musculoskeletal: no deformities, strength intact in all 4 Skin: moist, warm, no rashes Neurological: no tremor with outstretched hands, DTR normal in all 4   ASSESSMENT: 1. Hashimoto's hypothyroidism  PLAN:  1. Patient with history of euthyroid Hashimoto's thyroiditis, but became hypothyroid in 2018.  We started levothyroxine in 04/2016.  Last increase in dose was to 75 mcg daily in 08/2016.  She continues on this dose now. -At last visit, since her antibodies were still elevated at the previous check, I again suggested to try selenium 200 mcg daily.  We also discussed about stopping dairy which can help reducing inflammation and immune activation. - latest thyroid labs reviewed with pt >> normal 02/2017 - pt feels good on this dose.  - as she lost such a large amount of weight >> we discussed that she may need a reduction in her LT4 dose after we check her TFTs today - we discussed  about taking the thyroid hormone every day, with water, >30 minutes before breakfast, separated by >4 hours from acid reflux medications, calcium, iron, multivitamins. Pt. is taking it correctly.   - will check thyroid tests today: TSH and fT4  -will add TPO and ATA Abs to check the activity of her Hashimoto's ds. - If labs are abnormal, she will need to return for repeat TFTs in 1.5 months -Otherwise, I will see her back in a year  Needs refills  - time spent with the patient: 15 min, of which >50% was spent in obtaining information about her symptoms, reviewing her previous labs, evaluations, and treatments, counseling her about her condition (please see the discussed topics above),  and developing a plan to further investigate and treat it; she had a number of questions which I addressed.  Component     Latest Ref Rng & Units 02/14/2018  Thyroperoxidase Ab SerPl-aCnc     <9 IU/mL 208 (H)  Thyroglobulin Ab     < or = 1 IU/mL 16 (H)  TSH     0.35 - 4.50 uIU/mL 2.89  T4,Free(Direct)     0.60 - 1.60 ng/dL 1.611.02   Much improved thyroid antibodies.  TFTs are normal.  Carlus Pavlovristina Caelynn Marshman, MD PhD [facility]

## 2018-02-15 LAB — THYROID PEROXIDASE ANTIBODY: THYROID PEROXIDASE ANTIBODY: 208 [IU]/mL — AB (ref <9)

## 2018-02-15 LAB — THYROGLOBULIN ANTIBODY: Thyroglobulin Ab: 16 IU/mL — ABNORMAL HIGH (ref <=1)

## 2018-02-15 MED ORDER — LEVOTHYROXINE SODIUM 75 MCG PO TABS: 75.0000 ug | ORAL_TABLET | Freq: Every day | ORAL | 3 refills | Status: DC

## 2018-02-28 ENCOUNTER — Telehealth: Payer: Self-pay

## 2018-02-28 NOTE — Telephone Encounter (Signed)
Received fax that patient is not a current patient at [facility]

## 2018-07-04 ENCOUNTER — Other Ambulatory Visit: Payer: Self-pay

## 2018-07-04 DIAGNOSIS — F419 Anxiety disorder, unspecified: Secondary | ICD-10-CM

## 2018-07-04 MED ORDER — FLUOXETINE HCL 20 MG PO CAPS: 20.0000 mg | ORAL_CAPSULE | Freq: Every day | ORAL | 0 refills | Status: DC

## 2018-07-04 NOTE — Telephone Encounter (Signed)
Appointment desk to contact her to schedule CE since overdue.

## 2018-09-29 ENCOUNTER — Other Ambulatory Visit: Payer: Self-pay | Admitting: Women's Health

## 2018-09-29 DIAGNOSIS — F419 Anxiety disorder, unspecified: Secondary | ICD-10-CM

## 2018-12-14 ENCOUNTER — Encounter: Payer: Self-pay | Admitting: Women's Health

## 2018-12-14 DIAGNOSIS — Z1231 Encounter for screening mammogram for malignant neoplasm of breast: Secondary | ICD-10-CM | POA: Diagnosis not present

## 2019-01-01 ENCOUNTER — Other Ambulatory Visit: Payer: Self-pay

## 2019-01-02 ENCOUNTER — Ambulatory Visit (INDEPENDENT_AMBULATORY_CARE_PROVIDER_SITE_OTHER): Payer: BC Managed Care – PPO | Admitting: Women's Health

## 2019-01-02 ENCOUNTER — Encounter: Payer: Self-pay | Admitting: Women's Health

## 2019-01-02 VITALS — BP 114/80 | Ht 66.0 in | Wt 180.8 lb

## 2019-01-02 DIAGNOSIS — E559 Vitamin D deficiency, unspecified: Secondary | ICD-10-CM

## 2019-01-02 DIAGNOSIS — Z13220 Encounter for screening for lipoid disorders: Secondary | ICD-10-CM

## 2019-01-02 DIAGNOSIS — Z01419 Encounter for gynecological examination (general) (routine) without abnormal findings: Secondary | ICD-10-CM | POA: Diagnosis not present

## 2019-01-02 DIAGNOSIS — F419 Anxiety disorder, unspecified: Secondary | ICD-10-CM

## 2019-01-02 DIAGNOSIS — Z13820 Encounter for screening for osteoporosis: Secondary | ICD-10-CM

## 2019-01-02 MED ORDER — FLUOXETINE HCL 20 MG PO CAPS: 20.0000 mg | ORAL_CAPSULE | Freq: Every day | ORAL | 4 refills | Status: DC

## 2019-01-02 NOTE — Patient Instructions (Signed)
Good to see you today Vit D 2000 iu daily Colonoscopy [facility] [phone] dr Carlean Purl  Health Maintenance for Postmenopausal Women Menopause is a normal process in which your ability to get pregnant comes to an end. This process happens slowly over many months or years, usually between the ages of 88 and 34. Menopause is complete when you have missed your menstrual periods for 12 months. It is important to talk with your health care provider about some of the most common conditions that affect women after menopause (postmenopausal women). These include heart disease, cancer, and bone loss (osteoporosis). Adopting a healthy lifestyle and getting preventive care can help to promote your health and wellness. The actions you take can also lower your chances of developing some of these common conditions. What should I know about menopause? During menopause, you may get a number of symptoms, such as:  Hot flashes. These can be moderate or severe.  Night sweats.  Decrease in sex drive.  Mood swings.  Headaches.  Tiredness.  Irritability.  Memory problems.  Insomnia. Choosing to treat or not to treat these symptoms is a decision that you make with your health care provider. Do I need hormone replacement therapy?  Hormone replacement therapy is effective in treating symptoms that are caused by menopause, such as hot flashes and night sweats.  Hormone replacement carries certain risks, especially as you become older. If you are thinking about using estrogen or estrogen with progestin, discuss the benefits and risks with your health care provider. What is my risk for heart disease and stroke? The risk of heart disease, heart attack, and stroke increases as you age. One of the causes may be a change in the body's hormones during menopause. This can affect how your body uses dietary fats, triglycerides, and cholesterol. Heart attack and stroke are medical emergencies. There are many things that  you can do to help prevent heart disease and stroke. Watch your blood pressure  High blood pressure causes heart disease and increases the risk of stroke. This is more likely to develop in people who have high blood pressure readings, are of African descent, or are overweight.  Have your blood pressure checked: ? Every 3-5 years if you are 53-60 years of age. ? Every year if you are 30 years old or older. Eat a healthy diet   Eat a diet that includes plenty of vegetables, fruits, low-fat dairy products, and lean protein.  Do not eat a lot of foods that are high in solid fats, added sugars, or sodium. Get regular exercise Get regular exercise. This is one of the most important things you can do for your health. Most adults should:  Try to exercise for at least 150 minutes each week. The exercise should increase your heart rate and make you sweat (moderate-intensity exercise).  Try to do strengthening exercises at least twice each week. Do these in addition to the moderate-intensity exercise.  Spend less time sitting. Even light physical activity can be beneficial. Other tips  Work with your health care provider to achieve or maintain a healthy weight.  Do not use any products that contain nicotine or tobacco, such as cigarettes, e-cigarettes, and chewing tobacco. If you need help quitting, ask your health care provider.  Know your numbers. Ask your health care provider to check your cholesterol and your blood sugar (glucose). Continue to have your blood tested as directed by your health care provider. Do I need screening for cancer? Depending on your health history  and family history, you may need to have cancer screening at different stages of your life. This may include screening for:  Breast cancer.  Cervical cancer.  Lung cancer.  Colorectal cancer. What is my risk for osteoporosis? After menopause, you may be at increased risk for osteoporosis. Osteoporosis is a condition  in which bone destruction happens more quickly than new bone creation. To help prevent osteoporosis or the bone fractures that can happen because of osteoporosis, you may take the following actions:  If you are 72-37 years old, get at least 1,000 mg of calcium and at least 600 mg of vitamin D per day.  If you are older than age 67 but younger than age 71, get at least 1,200 mg of calcium and at least 600 mg of vitamin D per day.  If you are older than age 38, get at least 1,200 mg of calcium and at least 800 mg of vitamin D per day. Smoking and drinking excessive alcohol increase the risk of osteoporosis. Eat foods that are rich in calcium and vitamin D, and do weight-bearing exercises several times each week as directed by your health care provider. How does menopause affect my mental health? Depression may occur at any age, but it is more common as you become older. Common symptoms of depression include:  Low or sad mood.  Changes in sleep patterns.  Changes in appetite or eating patterns.  Feeling an overall lack of motivation or enjoyment of activities that you previously enjoyed.  Frequent crying spells. Talk with your health care provider if you think that you are experiencing depression. General instructions See your health care provider for regular wellness exams and vaccines. This may include:  Scheduling regular health, dental, and eye exams.  Getting and maintaining your vaccines. These include: ? Influenza vaccine. Get this vaccine each year before the flu season begins. ? Pneumonia vaccine. ? Shingles vaccine. ? Tetanus, diphtheria, and pertussis (Tdap) booster vaccine. Your health care provider may also recommend other immunizations. Tell your health care provider if you have ever been abused or do not feel safe at home. Summary  Menopause is a normal process in which your ability to get pregnant comes to an end.  This condition causes hot flashes, night sweats,  decreased interest in sex, mood swings, headaches, or lack of sleep.  Treatment for this condition may include hormone replacement therapy.  Take actions to keep yourself healthy, including exercising regularly, eating a healthy diet, watching your weight, and checking your blood pressure and blood sugar levels.  Get screened for cancer and depression. Make sure that you are up to date with all your vaccines. This information is not intended to replace advice given to you by your health care provider. Make sure you discuss any questions you have with your health care provider. Document Released: 02/11/2005 Document Revised: 12/13/2017 Document Reviewed: 12/13/2017 Elsevier Patient Education  2020 [employer].

## 2019-01-02 NOTE — Progress Notes (Signed)
Rebekah Aguilar 05/30/1965 [id number]    History:    Presents for annual exam.  Postmenopausal on no HRT with no bleeding.  Normal Pap and mammogram history.  Reports normal mammogram this month at [place], not in the epic system.  Has not had a screening colonoscopy.  Has not had a DEXA.  Anxiety/depression had been on Prozac thought she might be able to stop but states has had increased anxiety would like to go back on.  Has had counseling in the past.  Hypothyroid, endocrinologist manages.  Past medical history, past surgical history, family history and social history were all reviewed and documented in the EPIC chart.  Currently working from home in [employer], [employer] for [employer]. daughter 36 at [place] in theater doing well.  ROS:  A ROS was performed and pertinent positives and negatives are included.  Exam:  Vitals:   01/02/19 0926  BP: 114/80  Weight: 180 lb 12.8 oz (82 kg)  Height: 5\' 6"  (1.676 m)   Body mass index is 29.18 kg/m.   General appearance:  Normal Thyroid:  Symmetrical, normal in size, without palpable masses or nodularity. Respiratory  Auscultation:  Clear without wheezing or rhonchi Cardiovascular  Auscultation:  Regular rate, without rubs, murmurs or gallops  Edema/varicosities:  Not grossly evident Abdominal  Soft,nontender, without masses, guarding or rebound.  Liver/spleen:  No organomegaly noted  Hernia:  None appreciated  Skin  Inspection:  Grossly normal   Breasts: Examined lying and sitting.     Right: Without masses, retractions, discharge or axillary adenopathy.     Left: Without masses, retractions, discharge or axillary adenopathy. Gentitourinary   Inguinal/mons:  Normal without inguinal adenopathy  External genitalia:  Normal  BUS/Urethra/Skene's glands:  Normal  Vagina:  Normal  Cervix:  Normal  Uterus: normal in size, shape and contour.  Midline and mobile  Adnexa/parametria:     Rt: Without masses or tenderness.   Lt: Without masses or  tenderness.  Anus and perineum: Normal  Digital rectal exam: Normal sphincter tone without palpated masses or tenderness  Assessment/Plan:  53 y.o. MWF G4 P2 (1 placed for adoption at birth) for annual exam with no complaints.  Postmenopausal/no HRT/no bleeding. Hypothyroid-endocrinologist manages 17 pound weight loss with weight watchers Anxiety/depression  Plan: Reviewed importance of screening colonoscopy, [facility] information given instructed to schedule.  Prozac 20 mg p.o. daily prescription, proper use given and reviewed importance of self-care, leisure activities and regular daily exercise and counseling as needed.  SBEs, continue annual screening mammogram, calcium rich foods, vitamin D 2000 daily encouraged.  Congratulated on weight loss and healthy lifestyle.  DEXA, check with insurance company to check coverage, schedule here.  CBC, CMP, lipid panel, vitamin D, Pap normal 2017, new screening guidelines reviewed.    [place], 10:00 AM 01/02/2019

## 2019-01-03 LAB — VITAMIN D 25 HYDROXY (VIT D DEFICIENCY, FRACTURES): Vit D, 25-Hydroxy: 29 ng/mL — ABNORMAL LOW (ref 30–100)

## 2019-01-03 LAB — COMPREHENSIVE METABOLIC PANEL
AG Ratio: 1.7 (calc) (ref 1.0–2.5)
ALT: 23 U/L (ref 6–29)
AST: 20 U/L (ref 10–35)
Albumin: 4.4 g/dL (ref 3.6–5.1)
Alkaline phosphatase (APISO): 45 U/L (ref 37–153)
BUN: 15 mg/dL (ref 7–25)
CO2: 30 mmol/L (ref 20–32)
Calcium: 9.4 mg/dL (ref 8.6–10.4)
Chloride: 103 mmol/L (ref 98–110)
Creat: 0.71 mg/dL (ref 0.50–1.05)
Globulin: 2.6 g/dL (calc) (ref 1.9–3.7)
Glucose, Bld: 82 mg/dL (ref 65–99)
Potassium: 4.2 mmol/L (ref 3.5–5.3)
Sodium: 140 mmol/L (ref 135–146)
Total Bilirubin: 0.6 mg/dL (ref 0.2–1.2)
Total Protein: 7 g/dL (ref 6.1–8.1)

## 2019-01-03 LAB — CBC WITH DIFFERENTIAL/PLATELET
Absolute Monocytes: 575 cells/uL (ref 200–950)
Basophils Absolute: 32 cells/uL (ref 0–200)
Basophils Relative: 0.7 %
Eosinophils Absolute: 101 cells/uL (ref 15–500)
Eosinophils Relative: 2.2 %
HCT: 38.2 % (ref 35.0–45.0)
Hemoglobin: 13 g/dL (ref 11.7–15.5)
Lymphs Abs: 1895 cells/uL (ref 850–3900)
MCH: 31.1 pg (ref 27.0–33.0)
MCHC: 34 g/dL (ref 32.0–36.0)
MCV: 91.4 fL (ref 80.0–100.0)
MPV: 10.2 fL (ref 7.5–12.5)
Monocytes Relative: 12.5 %
Neutro Abs: 1996 cells/uL (ref 1500–7800)
Neutrophils Relative %: 43.4 %
Platelets: 274 10*3/uL (ref 140–400)
RBC: 4.18 10*6/uL (ref 3.80–5.10)
RDW: 12 % (ref 11.0–15.0)
Total Lymphocyte: 41.2 %
WBC: 4.6 10*3/uL (ref 3.8–10.8)

## 2019-01-03 LAB — LIPID PANEL
Cholesterol: 216 mg/dL — ABNORMAL HIGH (ref <200)
HDL: 65 mg/dL (ref >=50)
LDL Cholesterol (Calc): 130 mg/dL (calc) — ABNORMAL HIGH
Non-HDL Cholesterol (Calc): 151 mg/dL (calc) — ABNORMAL HIGH (ref <130)
Total CHOL/HDL Ratio: 3.3 (calc) (ref <5.0)
Triglycerides: 100 mg/dL (ref <150)

## 2019-01-16 DIAGNOSIS — N898 Other specified noninflammatory disorders of vagina: Secondary | ICD-10-CM | POA: Diagnosis not present

## 2019-01-16 DIAGNOSIS — B373 Candidiasis of vulva and vagina: Secondary | ICD-10-CM | POA: Diagnosis not present

## 2019-01-23 ENCOUNTER — Other Ambulatory Visit: Payer: Self-pay

## 2019-01-24 ENCOUNTER — Other Ambulatory Visit: Payer: Self-pay | Admitting: Women's Health

## 2019-01-24 ENCOUNTER — Ambulatory Visit (INDEPENDENT_AMBULATORY_CARE_PROVIDER_SITE_OTHER): Payer: BC Managed Care – PPO

## 2019-01-24 ENCOUNTER — Other Ambulatory Visit: Payer: Self-pay | Admitting: Obstetrics & Gynecology

## 2019-01-24 DIAGNOSIS — Z13820 Encounter for screening for osteoporosis: Secondary | ICD-10-CM

## 2019-01-24 DIAGNOSIS — Z780 Asymptomatic menopausal state: Secondary | ICD-10-CM

## 2019-02-13 ENCOUNTER — Other Ambulatory Visit: Payer: Self-pay

## 2019-02-15 ENCOUNTER — Ambulatory Visit: Payer: BC Managed Care – PPO | Admitting: Internal Medicine

## 2019-02-15 ENCOUNTER — Other Ambulatory Visit: Payer: Self-pay

## 2019-02-15 ENCOUNTER — Encounter: Payer: Self-pay | Admitting: Internal Medicine

## 2019-02-15 VITALS — BP 110/68 | HR 85 | Ht 66.0 in | Wt 180.0 lb

## 2019-02-15 DIAGNOSIS — E038 Other specified hypothyroidism: Secondary | ICD-10-CM | POA: Diagnosis not present

## 2019-02-15 DIAGNOSIS — E063 Autoimmune thyroiditis: Secondary | ICD-10-CM | POA: Diagnosis not present

## 2019-02-15 LAB — TSH: TSH: 3.12 u[IU]/mL (ref 0.35–4.50)

## 2019-02-15 LAB — T4, FREE: Free T4: 0.95 ng/dL (ref 0.60–1.60)

## 2019-02-15 MED ORDER — LEVOTHYROXINE SODIUM 75 MCG PO TABS: 75.0000 ug | ORAL_TABLET | Freq: Every day | ORAL | 3 refills | Status: DC

## 2019-02-15 NOTE — Patient Instructions (Signed)
Please stop at the lab.  Please continue Levothyroxine 75 mcg daily.  Take the thyroid hormone every day, with water, at least 30 minutes before breakfast, separated by at least 4 hours from: - acid reflux medications - calcium - iron - multivitamins  Please come back for a follow-up appointment in 1 year. 

## 2019-02-15 NOTE — Progress Notes (Signed)
Patient ID: Rebekah Aguilar, female   DOB: 07/27/1965, 54 y.o.   MRN: [id number]  This visit occurred during the SARS-CoV-2 public health emergency.  Safety protocols were in place, including screening questions prior to the visit, additional usage of staff PPE, and extensive cleaning of exam room while observing appropriate contact time as indicated for disinfecting solutions.   HPI  Rebekah Aguilar is a 54 y.o.-year-old female, initially referred by [name], NP, now returning for Hashimoto's hypothyroidism. Last visit 1 year ago.  At last visit, she was on weight watchers and lost 46 pounds in the previous year.  However, since then, during the coronavirus pandemic, she gained some weight back (but only 7 pounds). She continues on Weight Watchers.  Reviewed and addended history  Pt. has been found to have a slightly high TSH in 09/2013, which, however, improved to normal in 2016 and 2017.  Her TPO antibodies were very high, confirming Hashimoto's thyroiditis. However, she had a clearly elevated TSH as checked in 01/2016 and she was referred back to endocrinology.   We started levothyroxine 25 mcg daily in 04/2016.  We subsequently increased the dose.  Pt is on levothyroxine 75 mcg daily, taken: - in am - fasting - at least 30 min from coffee + cream and also b'fast - + occasional Tums and PPIs later in the day - + MVI at lunchtime - not on Biotin  Reviewed patient's TFTs: Lab Results  Component Value Date   TSH 2.89 02/14/2018   TSH 2.77 02/14/2017   TSH 6.20 (H) 10/13/2016   TSH 4.44 08/12/2016   TSH 7.80 (H) 05/25/2016   TSH 11.43 (H) 04/13/2016   TSH 27.13 (H) 02/03/2016   TSH 3.936 01/22/2015   TSH 1.00 10/11/2013   TSH 5.585 (H) 09/12/2013   FREET4 1.02 02/14/2018   FREET4 0.94 02/14/2017   FREET4 0.89 10/13/2016   FREET4 0.89 08/12/2016   FREET4 0.79 05/25/2016   FREET4 0.76 04/13/2016   FREET4 0.8 02/03/2016   FREET4 0.79 10/11/2013   T3FREE 3.5 04/13/2016   T3FREE 3.0 10/11/2013   Her thyroid antibodies were elevated, indicating Hashimoto's thyroiditis, but much improved at last visit: Component     Latest Ref Rng & Units 01/22/2015 02/03/2016 02/14/2018  Thyroperoxidase Ab SerPl-aCnc     <9 IU/mL 277 (H) >900 (H) 208 (H)  Thyroglobulin Ab     < or = 1 IU/mL 3 (H) 22 (H) 16 (H)   She takes selenium, which we started to decrease her antithyroid antibodies >> stopped.   Pt denies: - feeling nodules in neck - hoarseness - dysphagia - choking - SOB with lying down  She has no FH of thyroid disorders. No FH of thyroid cancer. No h/o radiation tx to head or neck.  No herbal supplements. No Biotin use. No recent steroids use.   She also has history of GERD, joint pain.  ROS: Constitutional: + weight gain/no weight loss, no fatigue, no subjective hyperthermia, no subjective hypothermia Eyes: no blurry vision, no xerophthalmia ENT: no sore throat, + see HPI Cardiovascular: no CP/no SOB/no palpitations/no leg swelling Respiratory: no cough/no SOB/no wheezing Gastrointestinal: no N/no V/no D/no C/no acid reflux Musculoskeletal: no muscle aches/+ joint aches Skin: no rashes, no hair loss Neurological: no tremors/no numbness/no tingling/no dizziness  I reviewed pt's medications, allergies, PMH, social hx, family hx, and changes were documented in the history of present illness. Otherwise, unchanged from my initial visit note.  Past Medical History:  Diagnosis Date  .  Acid reflux   . PMS (premenstrual syndrome)    Past Surgical History:  Procedure Laterality Date  . GASTROSTOMY TUBE PLACEMENT  9/04  . TONSILLECTOMY AND ADENOIDECTOMY     CHILD AGE   History   Social History  . Marital Status: Married    Spouse Name: N/A    Number of Children: 2   Occupational History  .  Accountant   Social History Main Topics  . Smoking status: Never Smoker   . Smokeless tobacco: Never Used  . Alcohol Use: Yes     Comment: occassionally  .  Drug Use: No  . Sexual Activity: Yes    Partners: Male    Birth Control/ Protection: Condom    Social drinker: beer   Current Outpatient Medications on File Prior to Visit  Medication Sig Dispense Refill  . FLUoxetine (PROZAC) 20 MG capsule Take 1 capsule (20 mg total) by mouth daily. 90 capsule 4  . levothyroxine (SYNTHROID, LEVOTHROID) 75 MCG tablet Take 1 tablet (75 mcg total) by mouth daily before breakfast. 90 tablet 3  . Loratadine (CLARITIN PO) as needed. ALLERGIES     . Multiple Vitamins-Minerals (CENTRUM PO) daily.      . Omeprazole (PRILOSEC PO) as needed. REFLUX      No current facility-administered medications on file prior to visit.   No Known Allergies Family History  Problem Relation Age of Onset  . Hypertension Mother   . Lung cancer Mother        DIED IN 02-25-22  . Diabetes Mother   . Diabetes Maternal Grandmother    PE: BP 110/68   Pulse 85   Ht 5\' 6"  (1.676 m)   Wt 180 lb (81.6 kg)   SpO2 96%   BMI 29.05 kg/m  Wt Readings from Last 3 Encounters:  02/15/19 180 lb (81.6 kg)  01/02/19 180 lb 12.8 oz (82 kg)  02/14/18 173 lb (78.5 kg)   Constitutional: overweight, in NAD Eyes: PERRLA, EOMI, no exophthalmos ENT: moist mucous membranes, + slight symmetric thyromegaly, no cervical lymphadenopathy Cardiovascular: RRR, No MRG Respiratory: CTA B Gastrointestinal: abdomen soft, NT, ND, BS+ Musculoskeletal: no deformities, strength intact in all 4 Skin: moist, warm, no rashes Neurological: no tremor with outstretched hands, DTR normal in all 4   ASSESSMENT: 1. Hashimoto's hypothyroidism  PLAN:  1. Patient with history of euthyroid Hashimoto's thyroiditis, with development of hypothyroidism in 2018.  We started levothyroxine in 04/2016.  Last dose increase was in 08/2016, to 75 mcg daily.   -2 years ago, since her antibodies were elevated, I again suggested to try selenium 200 mcg daily, which she did start.  I also suggested stopping dairy to help reduce  inflammation and immune activation.  After these measures, her antibodies decrease significantly at last check. - latest thyroid labs reviewed with pt >> normal: Lab Results  Component Value Date   TSH 2.89 02/14/2018   - she continues on LT4 75 mcg daily - pt feels good on this dose. - we discussed about taking the thyroid hormone every day, with water, >30 minutes before breakfast, separated by >4 hours from acid reflux medications, calcium, iron, multivitamins. Pt. is taking it correctly. - will check thyroid tests today: TSH and fT4  - If labs are abnormal, she will need to return for repeat TFTs in 1.5 months - I will see her back in a year  Needs refills.  Office Visit on 02/15/2019  Component Date Value Ref Range Status  . TSH  02/15/2019 3.12  0.35 - 4.50 uIU/mL Final  . Free T4 02/15/2019 0.95  0.60 - 1.60 ng/dL Final   Comment: Specimens from patients who are undergoing biotin therapy and /or ingesting biotin supplements may contain high levels of biotin.  The higher biotin concentration in these specimens interferes with this Free T4 assay.  Specimens that contain high levels  of biotin may cause false high results for this Free T4 assay.  Please interpret results in light of the total clinical presentation of the patient.      Carlus Pavlov, MD PhD [facility]

## 2019-02-20 IMAGING — US US EXTREM LOW VENOUS*L*
1 series · 13 of 24 positions shown · non-contrast
Comparison: None.

CLINICAL DATA: Left lower extremity swelling and pain



[Series 1: us extrem low venous*left* · 0.08mm/px · 13 of 30 slices shown]
[im 1/30]
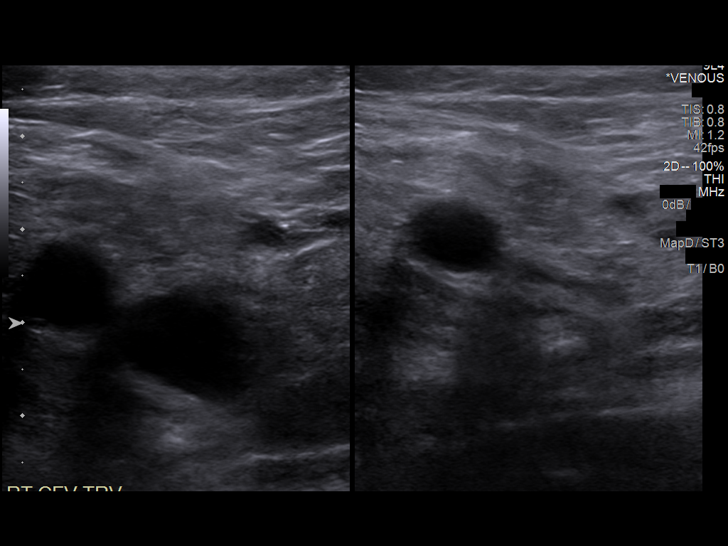
[im 3/30]
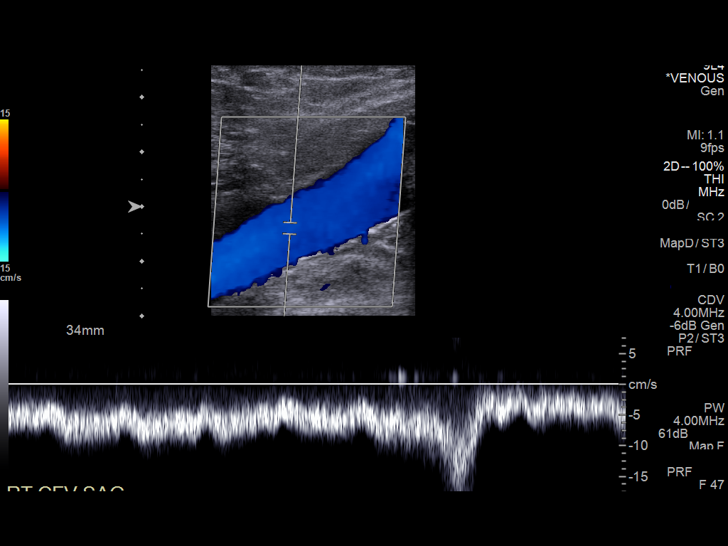
[im 6/30]
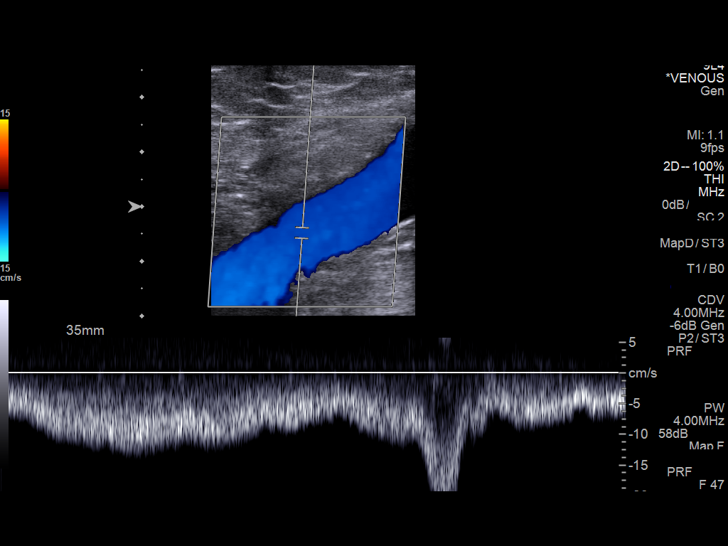
[im 8/30]
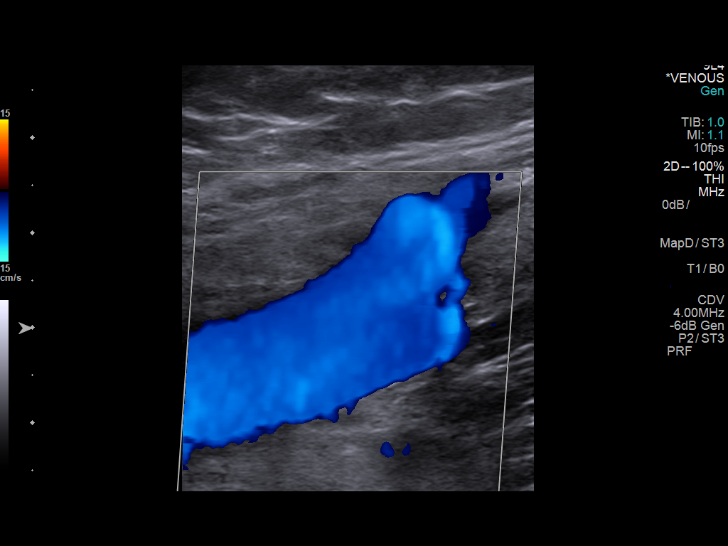
[im 11/30]
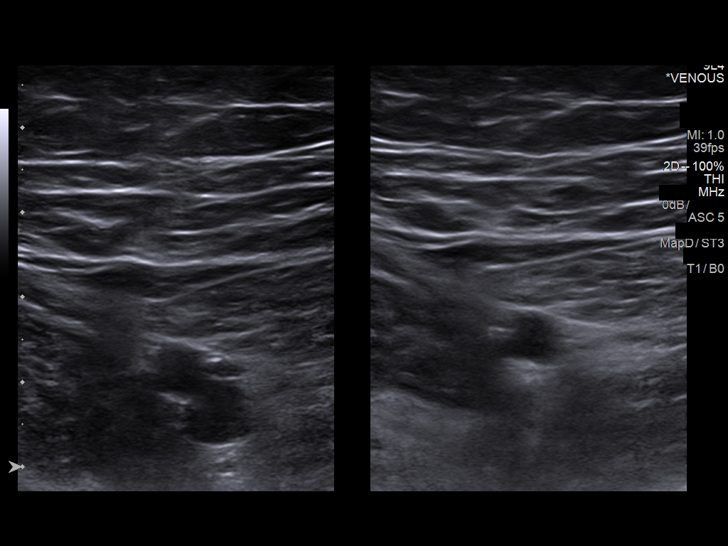
[im 13/30]
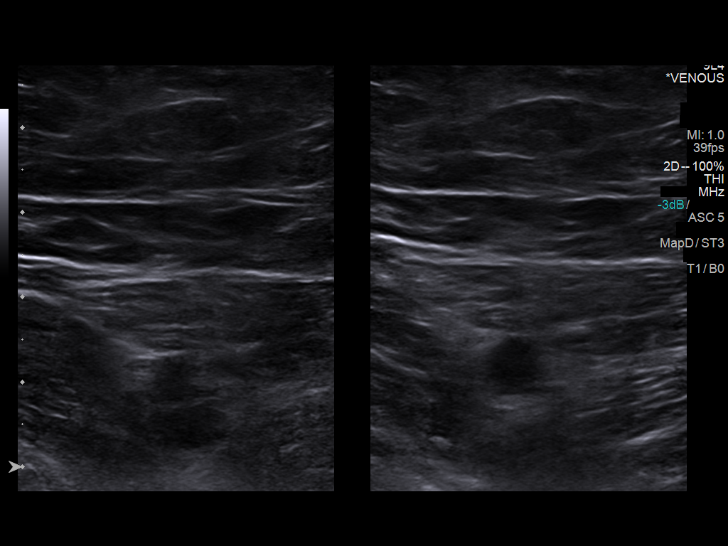
[im 16/30]
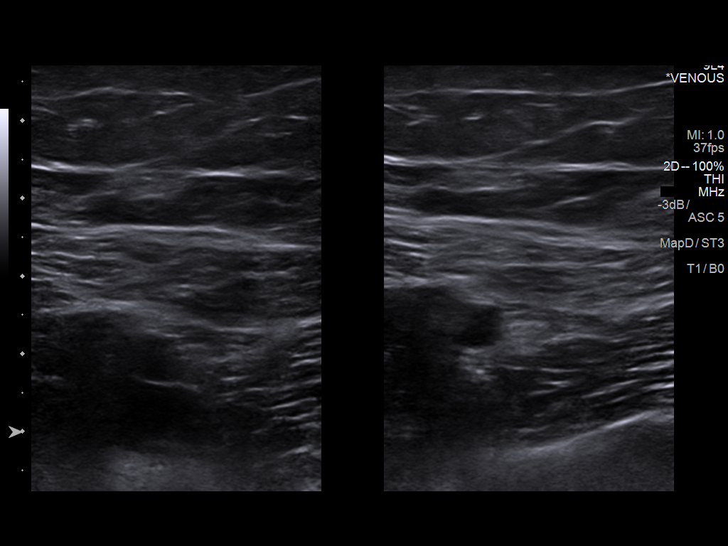
[im 17/30]
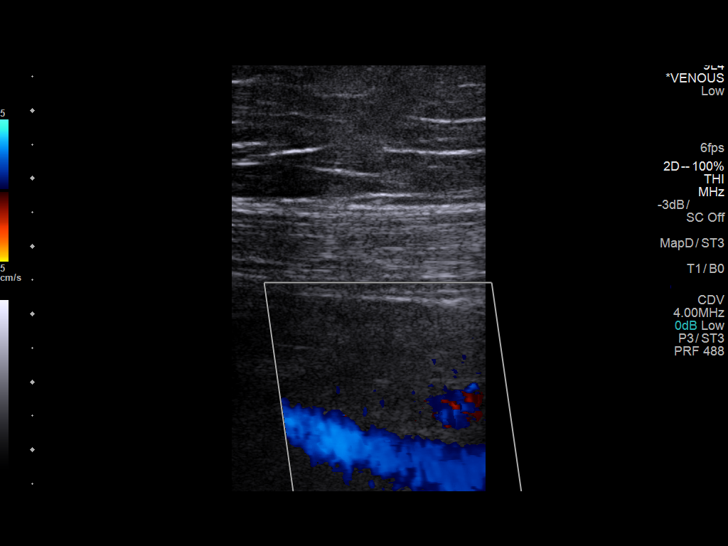
[im 19/30]
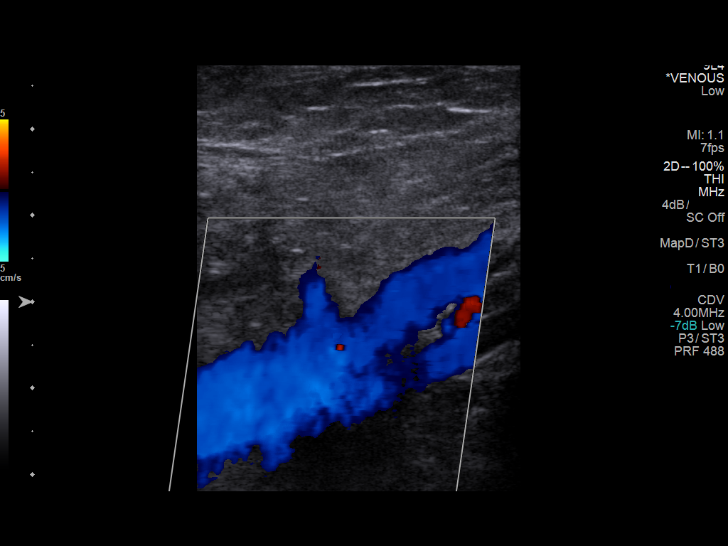
[im 22/30]
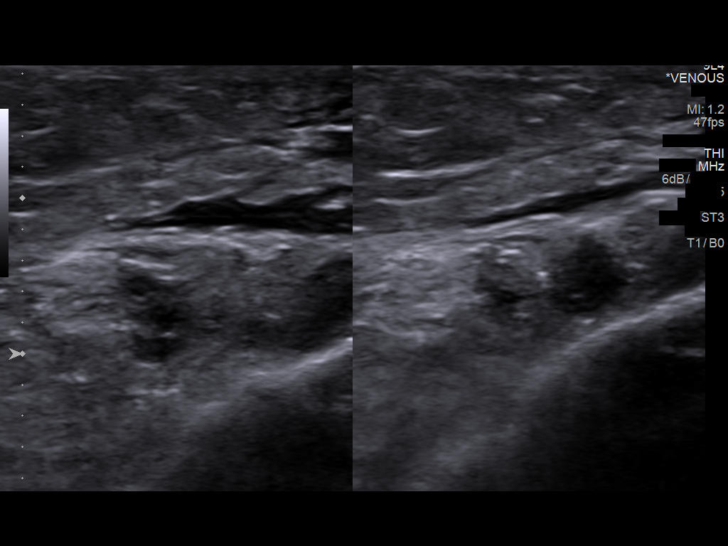
[im 24/30]
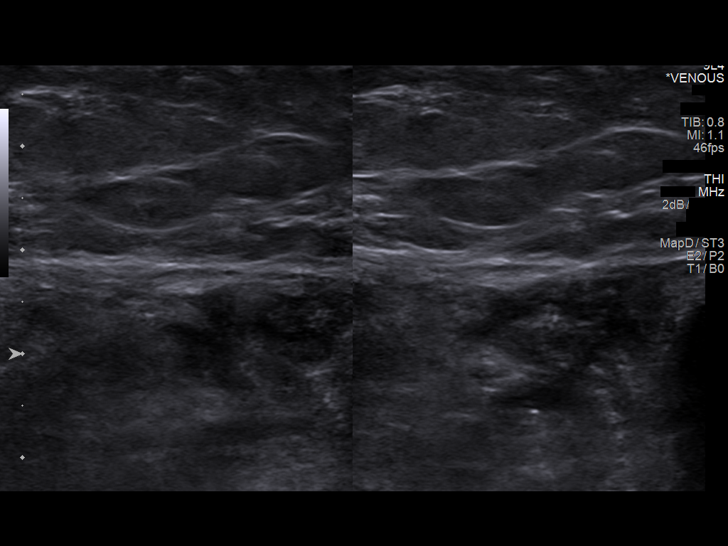
[im 27/30]
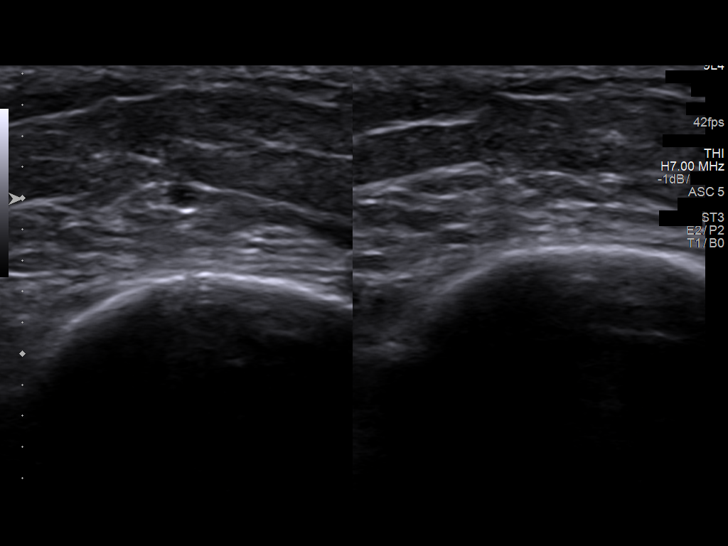
[im 30/30]
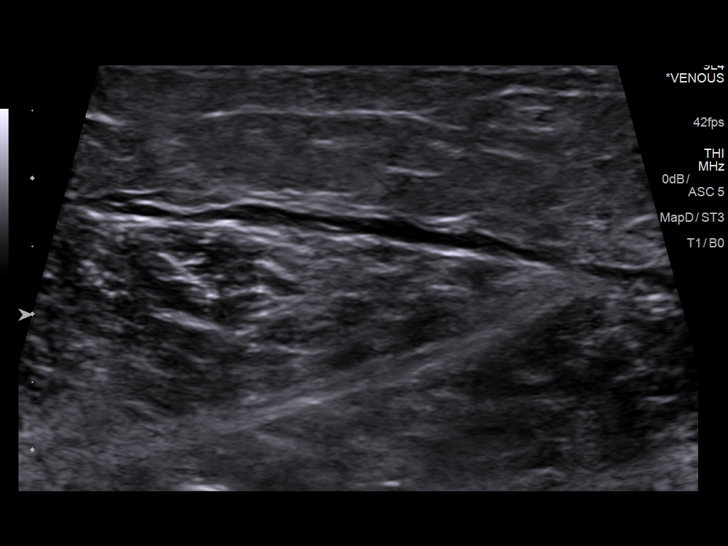

[13 of 24 positions shown; findings below may reference images not displayed]

FINDINGS: Contralateral Common Femoral Vein: Respiratory phasicity is normal
and symmetric with the symptomatic side. No evidence of thrombus.
Normal compressibility.

Common Femoral Vein: No evidence of thrombus. Normal
compressibility, respiratory phasicity and response to augmentation.

Saphenofemoral Junction: No evidence of thrombus. Normal
compressibility and flow on color Doppler imaging.

Profunda Femoral Vein: No evidence of thrombus. Normal
compressibility and flow on color Doppler imaging.

Femoral Vein: No evidence of thrombus. Normal compressibility,
respiratory phasicity and response to augmentation.

Popliteal Vein: No evidence of thrombus. Normal compressibility,
respiratory phasicity and response to augmentation.

Calf Veins: No evidence of thrombus. Normal compressibility and flow
on color Doppler imaging.

Superficial Great Saphenous Vein: No evidence of thrombus. Normal
compressibility.

Venous Reflux:  None.

Other Findings:  None.
IMPRESSION: No evidence of deep venous thrombosis.

## 2019-10-29 DIAGNOSIS — Z23 Encounter for immunization: Secondary | ICD-10-CM | POA: Diagnosis not present

## 2020-02-18 ENCOUNTER — Encounter: Payer: Self-pay | Admitting: Internal Medicine

## 2020-02-18 ENCOUNTER — Other Ambulatory Visit: Payer: Self-pay

## 2020-02-18 ENCOUNTER — Ambulatory Visit: Payer: BC Managed Care – PPO | Admitting: Internal Medicine

## 2020-02-18 VITALS — BP 130/88 | HR 77 | Ht 66.0 in | Wt 191.6 lb

## 2020-02-18 DIAGNOSIS — E038 Other specified hypothyroidism: Secondary | ICD-10-CM

## 2020-02-18 DIAGNOSIS — E063 Autoimmune thyroiditis: Secondary | ICD-10-CM

## 2020-02-18 LAB — T4, FREE: Free T4: 0.93 ng/dL (ref 0.60–1.60)

## 2020-02-18 LAB — TSH: TSH: 2.37 u[IU]/mL (ref 0.35–4.50)

## 2020-02-18 MED ORDER — LEVOTHYROXINE SODIUM 75 MCG PO TABS: 75.0000 ug | ORAL_TABLET | Freq: Every day | ORAL | 3 refills | Status: DC

## 2020-02-18 NOTE — Patient Instructions (Signed)
Please stop at the lab.  Please continue Levothyroxine 75 mcg daily.  Take the thyroid hormone every day, with water, at least 30 minutes before breakfast, separated by at least 4 hours from: - acid reflux medications - calcium - iron - multivitamins  Please come back for a follow-up appointment in 1 year. 

## 2020-02-18 NOTE — Progress Notes (Signed)
Patient ID: Rebekah Aguilar, female   DOB: January 17, 1965, 55 y.o.   MRN: [id number]  This visit occurred during the SARS-CoV-2 public health emergency.  Safety protocols were in place, including screening questions prior to the visit, additional usage of staff PPE, and extensive cleaning of exam room while observing appropriate contact time as indicated for disinfecting solutions.   HPI  Rebekah Aguilar is a 55 y.o.-year-old female, initially referred by Rebekah Rowan, NP, now returning for Hashimoto's hypothyroidism. Last visit 1 year ago.  Reviewed history:  Pt. has been found to have a slightly high TSH in 09/2013, which, however, improved to normal in 2016 and 2017.  Her TPO antibodies were very high, confirming Hashimoto's thyroiditis. However, she had a clearly elevated TSH as checked in 01/2016 and she was referred back to endocrinology.   We started levothyroxine 25 mcg daily in 04/2016.  We subsequently increased the dose.  Pt is on levothyroxine 75 mcg daily, taken: - missed few doses on LT4  - 2 in last month - in am - fasting - at least 30 min from b'fast and also coffee + cream - + Occasional calcium later in the day (Tums) - no iron - + multivitamins at lunchtime - + PPIs later in the day - not on Biotin  Reviewed patient's TFTs: Lab Results  Component Value Date   TSH 3.12 02/15/2019   TSH 2.89 02/14/2018   TSH 2.77 02/14/2017   TSH 6.20 (H) 10/13/2016   TSH 4.44 08/12/2016   TSH 7.80 (H) 05/25/2016   TSH 11.43 (H) 04/13/2016   TSH 27.13 (H) 02/03/2016   TSH 3.936 01/22/2015   TSH 1.00 10/11/2013   FREET4 0.95 02/15/2019   FREET4 1.02 02/14/2018   FREET4 0.94 02/14/2017   FREET4 0.89 10/13/2016   FREET4 0.89 08/12/2016   FREET4 0.79 05/25/2016   FREET4 0.76 04/13/2016   FREET4 0.8 02/03/2016   FREET4 0.79 10/11/2013   T3FREE 3.5 04/13/2016   T3FREE 3.0 10/11/2013   Her thyroid antibodies were elevated, indicating Hashimoto's thyroiditis, but much improved at  last check: Component     Latest Ref Rng & Units 01/22/2015 02/03/2016 02/14/2018  Thyroperoxidase Ab SerPl-aCnc     <9 IU/mL 277 (H) >900 (H) 208 (H)  Thyroglobulin Ab     < or = 1 IU/mL 3 (H) 22 (H) 16 (H)   She takes selenium, which we started to decrease her antithyroid antibodies >> stopped.   Pt denies: - feeling nodules in neck - hoarseness - dysphagia - choking - SOB with lying down  No FH of thyroid disease or thyroid cancer. No h/o radiation tx to head or neck.  No seaweed or kelp. No recent contrast studies. No herbal supplements. No Biotin use. No recent steroids use.   She also has a history of GERD and joint pains.  In the past, on weight watchers, she lost 46 pounds in a year.  Afterwards, she gained approximately 18 pounds back (11 pounds since last visit).  ROS: Constitutional: no weight gain/no weight loss, no fatigue, no subjective hyperthermia, no subjective hypothermia Eyes: no blurry vision, no xerophthalmia ENT: no sore throat, + see HPI Cardiovascular: no CP/no SOB/no palpitations/no leg swelling Respiratory: no cough/no SOB/no wheezing Gastrointestinal: no N/no V/no D/no C/no acid reflux Musculoskeletal: no muscle aches/+ joint aches Skin: no rashes, no hair loss Neurological: no tremors/no numbness/no tingling/no dizziness  I reviewed pt's medications, allergies, PMH, social hx, family hx, and changes were documented in the  history of present illness. Otherwise, unchanged from my initial visit note.  Past Medical History:  Diagnosis Date  . Acid reflux   . PMS (premenstrual syndrome)    Past Surgical History:  Procedure Laterality Date  . GASTROSTOMY TUBE PLACEMENT  9/04  . TONSILLECTOMY AND ADENOIDECTOMY     CHILD AGE   History   Social History  . Marital Status: Married    Spouse Name: N/A    Number of Children: 2   Occupational History  .  Accountant   Social History Main Topics  . Smoking status: Never Smoker   . Smokeless  tobacco: Never Used  . Alcohol Use: Yes     Comment: occassionally  . Drug Use: No  . Sexual Activity: Yes    Partners: Male    Birth Control/ Protection: Condom    Social drinker: beer   Current Outpatient Medications on File Prior to Visit  Medication Sig Dispense Refill  . FLUoxetine (PROZAC) 20 MG capsule Take 1 capsule (20 mg total) by mouth daily. 90 capsule 4  . levothyroxine (SYNTHROID) 75 MCG tablet Take 1 tablet (75 mcg total) by mouth daily before breakfast. 90 tablet 3  . Loratadine (CLARITIN PO) as needed. ALLERGIES     . Multiple Vitamins-Minerals (CENTRUM PO) daily.      . Omeprazole (PRILOSEC PO) as needed. REFLUX      No current facility-administered medications on file prior to visit.   No Known Allergies Family History  Problem Relation Age of Onset  . Hypertension Mother   . Lung cancer Mother        DIED IN 03/05/22  . Diabetes Mother   . Diabetes Maternal Grandmother    PE: BP 130/88   Pulse 77   Ht 5\' 6"  (1.676 m)   Wt 191 lb 9.6 oz (86.9 kg)   SpO2 97%   BMI 30.93 kg/m  Wt Readings from Last 3 Encounters:  02/18/20 191 lb 9.6 oz (86.9 kg)  02/15/19 180 lb (81.6 kg)  01/02/19 180 lb 12.8 oz (82 kg)   Constitutional: overweight, in NAD Eyes: PERRLA, EOMI, no exophthalmos ENT: moist mucous membranes, + slight symmetric thyromegaly, no cervical lymphadenopathy Cardiovascular: RRR, No MRG Respiratory: CTA B Gastrointestinal: abdomen soft, NT, ND, BS+ Musculoskeletal: no deformities, strength intact in all 4 Skin: moist, warm, no rashes Neurological: no tremor with outstretched hands, DTR normal in all 4   ASSESSMENT: 1. Hashimoto's hypothyroidism  PLAN:  1. Patient with history of euthyroid Hashimoto's thyroiditis, with development of hypothyroidism in 2018.  We started levothyroxine 04/2016. She was on selenium 200 mcg daily and I also suggested stopping dairy to help reduce inflammation and immune activation.  After these measures, her  antibodies decreased significantly.  We stopped selenium afterwards. - latest thyroid labs reviewed with pt >> normal: Lab Results  Component Value Date   TSH 3.12 02/15/2019   - she continues on LT4 75 mcg daily - pt feels good on this dose. - we discussed about taking the thyroid hormone every day, with water, >30 minutes before breakfast, separated by >4 hours from acid reflux medications, calcium, iron, multivitamins. Pt. is taking it correctly. - will check thyroid tests today: TSH and fT4 - If labs are abnormal, she will need to return for repeat TFTs in 1.5 months - I will see her back in 1 year  Needs refills.  Component     Latest Ref Rng & Units 02/18/2020  TSH     0.35 -  4.50 uIU/mL 2.37  T4,Free(Direct)     0.60 - 1.60 ng/dL 2.95   Normal TFTs.    Carlus Pavlov, MD PhD [facility]

## 2020-03-23 ENCOUNTER — Encounter: Payer: Self-pay | Admitting: Nurse Practitioner

## 2020-03-24 MED ORDER — FLUOXETINE HCL (PMDD) 20 MG PO TABS: 20.0000 mg | ORAL_TABLET | Freq: Every day | ORAL | 0 refills | Status: DC

## 2020-03-24 NOTE — Telephone Encounter (Signed)
Last AEX 01/02/19 Scheduled AEX 04/13/20

## 2020-04-13 ENCOUNTER — Other Ambulatory Visit (HOSPITAL_COMMUNITY)
Admission: RE | Admit: 2020-04-13 | Discharge: 2020-04-13 | Disposition: A | Discharge status: Home / Self Care | Payer: BC Managed Care – PPO | Source: Ambulatory Visit | Attending: Nurse Practitioner | Admitting: Nurse Practitioner

## 2020-04-13 ENCOUNTER — Ambulatory Visit: Payer: BC Managed Care – PPO | Admitting: Nurse Practitioner

## 2020-04-13 ENCOUNTER — Encounter: Payer: Self-pay | Admitting: Nurse Practitioner

## 2020-04-13 ENCOUNTER — Other Ambulatory Visit: Payer: Self-pay

## 2020-04-13 ENCOUNTER — Ambulatory Visit (INDEPENDENT_AMBULATORY_CARE_PROVIDER_SITE_OTHER): Payer: BC Managed Care – PPO | Admitting: Nurse Practitioner

## 2020-04-13 VITALS — BP 124/80 | Ht 67.0 in | Wt 196.0 lb

## 2020-04-13 DIAGNOSIS — Z1231 Encounter for screening mammogram for malignant neoplasm of breast: Secondary | ICD-10-CM | POA: Diagnosis not present

## 2020-04-13 DIAGNOSIS — Z01419 Encounter for gynecological examination (general) (routine) without abnormal findings: Secondary | ICD-10-CM | POA: Diagnosis not present

## 2020-04-13 DIAGNOSIS — N904 Leukoplakia of vulva: Secondary | ICD-10-CM

## 2020-04-13 DIAGNOSIS — Z8639 Personal history of other endocrine, nutritional and metabolic disease: Secondary | ICD-10-CM

## 2020-04-13 DIAGNOSIS — E559 Vitamin D deficiency, unspecified: Secondary | ICD-10-CM | POA: Diagnosis not present

## 2020-04-13 MED ORDER — CLOBETASOL PROPIONATE 0.05 % EX OINT: TOPICAL_OINTMENT | CUTANEOUS | 2 refills | Status: DC

## 2020-04-13 NOTE — Progress Notes (Signed)
   Rebekah Aguilar [birth year] [id number]   History:  55 y.o. [id number] presents for annual exam. She complaints of mild vuvlar itching that comes and goes, no discharge or odor. Postmenopausal - no HRT, no bleeding. Normal pap history. Bilateral benign nodules seen on diagnostic mammogram 12/2018, otherwise normal mammogram history. Hypothyroidism managed by endocrinology.   Gynecologic History No LMP recorded. Patient is postmenopausal.   Contraception/Family planning: post menopausal status  Health Maintenance Last Pap: 01/2015. Results were: normal Last mammogram/ultrasound: 12/2018. Results were: Benign bilateral nodules. Schedule for mammogram today.  Last colonoscopy: Never Last Dexa: 01/24/2019. Results were: normal  Past medical history, past surgical history, family history and social history were all reviewed and documented in the EPIC chart. 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  ROS:  A ROS was performed and pertinent positives and negatives are included.  Exam:  Vitals:   04/13/20 0920  BP: 124/80  Weight: 196 lb (88.9 kg)  Height: 5\' 7"  (1.702 m)   Body mass index is 30.7 kg/m.  General appearance:  Normal Thyroid:  Symmetrical, normal in size, without palpable masses or nodularity. Respiratory  Auscultation:  Clear without wheezing or rhonchi Cardiovascular  Auscultation:  Regular rate, without rubs, murmurs or gallops  Edema/varicosities:  Not grossly evident Abdominal  Soft,nontender, without masses, guarding or rebound.  Liver/spleen:  No organomegaly noted  Hernia:  None appreciated  Skin  Inspection:  Grossly normal   Breasts: Examined lying and sitting.   Right: Without masses, retractions, discharge or axillary adenopathy.   Left: Without masses, retractions, discharge or axillary adenopathy. Gentitourinary   Inguinal/mons:  Normal without inguinal adenopathy  External genitalia:  Thin, red appearance in figure-8 formation  consistent with LS  BUS/Urethra/Skene's glands:  Normal  Vagina:  Normal  Cervix:  Normal  Uterus:  Normal in size, shape and contour.  Midline and mobile  Adnexa/parametria:     Rt: Without masses or tenderness.   Lt: Without masses or tenderness.  Anus and perineum: Normal  Digital rectal exam: Normal sphincter tone without palpated masses or tenderness  Assessment/Plan:  55 y.o. 57 for annual exam.   Well female exam with routine gynecological exam - Plan: CBC with Differential/Platelet, Comprehensive metabolic panel, Cytology - PAP( [facility]). Education provided on SBEs, importance of preventative screenings, current guidelines, high calcium diet, regular exercise, and multivitamin daily.   History of vitamin D deficiency - Plan: VITAMIN D 25 Hydroxy (Vit-D Deficiency, Fractures)  Lichen sclerosus of vulva - Plan: clobetasol ointment (TEMOVATE) 0.05 % daily x 4 weeks, then every other day x 4 weeks, then twice weekly for maintenance.   Screening for cervical cancer - Normal Pap history.  Pap with HR HPV today.   Screening for breast cancer -  Bilateral benign nodules seen on diagnostic mammogram 12/2018, otherwise normal mammogram history. Continue annual screenings. Scheduled for mammogram today.  Normal breast exam today.  Screening for colon cancer - Has not had screening colonoscopy. We discussed current guidelines and importance of preventative screenings. Information provided on  GI and she plans to schedule this soon.   Return in 1 year for annual.         01/2019 DNP, 9:35 AM 04/13/2020

## 2020-04-13 NOTE — Patient Instructions (Addendum)
Schedule colonoscopy! [facility] [phone] [address] [place], [place] [address]  Health Maintenance for Postmenopausal Women Menopause is a normal process in which your ability to get pregnant comes to an end. This process happens slowly over many months or years, usually between the ages of 48 and 55. Menopause is complete when you have missed your menstrual periods for 12 months. It is important to talk with your health care provider about some of the most common conditions that affect women after menopause (postmenopausal women). These include heart disease, cancer, and bone loss (osteoporosis). Adopting a healthy lifestyle and getting preventive care can help to promote your health and wellness. The actions you take can also lower your chances of developing some of these common conditions. What should I know about menopause? During menopause, you may get a number of symptoms, such as:  Hot flashes. These can be moderate or severe.  Night sweats.  Decrease in sex drive.  Mood swings.  Headaches.  Tiredness.  Irritability.  Memory problems.  Insomnia. Choosing to treat or not to treat these symptoms is a decision that you make with your health care provider. Do I need hormone replacement therapy?  Hormone replacement therapy is effective in treating symptoms that are caused by menopause, such as hot flashes and night sweats.  Hormone replacement carries certain risks, especially as you become older. If you are thinking about using estrogen or estrogen with progestin, discuss the benefits and risks with your health care provider. What is my risk for heart disease and stroke? The risk of heart disease, heart attack, and stroke increases as you age. One of the causes may be a change in the body's hormones during menopause. This can affect how your body uses dietary fats, triglycerides, and cholesterol. Heart attack and stroke are medical emergencies. There are many things  that you can do to help prevent heart disease and stroke. Watch your blood pressure  High blood pressure causes heart disease and increases the risk of stroke. This is more likely to develop in people who have high blood pressure readings, are of African descent, or are overweight.  Have your blood pressure checked: ? Every 3-5 years if you are 18-39 years of age. ? Every year if you are 40 years old or older. Eat a healthy diet  Eat a diet that includes plenty of vegetables, fruits, low-fat dairy products, and lean protein.  Do not eat a lot of foods that are high in solid fats, added sugars, or sodium.   Get regular exercise Get regular exercise. This is one of the most important things you can do for your health. Most adults should:  Try to exercise for at least 150 minutes each week. The exercise should increase your heart rate and make you sweat (moderate-intensity exercise).  Try to do strengthening exercises at least twice each week. Do these in addition to the moderate-intensity exercise.  Spend less time sitting. Even light physical activity can be beneficial. Other tips  Work with your health care provider to achieve or maintain a healthy weight.  Do not use any products that contain nicotine or tobacco, such as cigarettes, e-cigarettes, and chewing tobacco. If you need help quitting, ask your health care provider.  Know your numbers. Ask your health care provider to check your cholesterol and your blood sugar (glucose). Continue to have your blood tested as directed by your health care provider. Do I need screening for cancer? Depending on your health history and family   history, you may need to have cancer screening at different stages of your life. This may include screening for:  Breast cancer.  Cervical cancer.  Lung cancer.  Colorectal cancer. What is my risk for osteoporosis? After menopause, you may be at increased risk for osteoporosis. Osteoporosis is a  condition in which bone destruction happens more quickly than new bone creation. To help prevent osteoporosis or the bone fractures that can happen because of osteoporosis, you may take the following actions:  If you are 19-50 years old, get at least 1,000 mg of calcium and at least 600 mg of vitamin D per day.  If you are older than age 50 but younger than age 70, get at least 1,200 mg of calcium and at least 600 mg of vitamin D per day.  If you are older than age 70, get at least 1,200 mg of calcium and at least 800 mg of vitamin D per day. Smoking and drinking excessive alcohol increase the risk of osteoporosis. Eat foods that are rich in calcium and vitamin D, and do weight-bearing exercises several times each week as directed by your health care provider. How does menopause affect my mental health? Depression may occur at any age, but it is more common as you become older. Common symptoms of depression include:  Low or sad mood.  Changes in sleep patterns.  Changes in appetite or eating patterns.  Feeling an overall lack of motivation or enjoyment of activities that you previously enjoyed.  Frequent crying spells. Talk with your health care provider if you think that you are experiencing depression. General instructions See your health care provider for regular wellness exams and vaccines. This may include:  Scheduling regular health, dental, and eye exams.  Getting and maintaining your vaccines. These include: ? Influenza vaccine. Get this vaccine each year before the flu season begins. ? Pneumonia vaccine. ? Shingles vaccine. ? Tetanus, diphtheria, and pertussis (Tdap) booster vaccine. Your health care provider may also recommend other immunizations. Tell your health care provider if you have ever been abused or do not feel safe at home. Summary  Menopause is a normal process in which your ability to get pregnant comes to an end.  This condition causes hot flashes, night  sweats, decreased interest in sex, mood swings, headaches, or lack of sleep.  Treatment for this condition may include hormone replacement therapy.  Take actions to keep yourself healthy, including exercising regularly, eating a healthy diet, watching your weight, and checking your blood pressure and blood sugar levels.  Get screened for cancer and depression. Make sure that you are up to date with all your vaccines. This information is not intended to replace advice given to you by your health care provider. Make sure you discuss any questions you have with your health care provider. Document Revised: 12/13/2017 Document Reviewed: 12/13/2017 Elsevier Patient Education  2021 Elsevier Inc.  

## 2020-04-14 LAB — CBC WITH DIFFERENTIAL/PLATELET
Absolute Monocytes: 673 cells/uL (ref 200–950)
Basophils Absolute: 51 cells/uL (ref 0–200)
Basophils Relative: 0.9 %
Eosinophils Absolute: 131 cells/uL (ref 15–500)
Eosinophils Relative: 2.3 %
HCT: 37.9 % (ref 35.0–45.0)
Hemoglobin: 12.6 g/dL (ref 11.7–15.5)
Lymphs Abs: 1881 cells/uL (ref 850–3900)
MCH: 31 pg (ref 27.0–33.0)
MCHC: 33.2 g/dL (ref 32.0–36.0)
MCV: 93.3 fL (ref 80.0–100.0)
MPV: 10.2 fL (ref 7.5–12.5)
Monocytes Relative: 11.8 %
Neutro Abs: 2964 cells/uL (ref 1500–7800)
Neutrophils Relative %: 52 %
Platelets: 310 10*3/uL (ref 140–400)
RBC: 4.06 10*6/uL (ref 3.80–5.10)
RDW: 12.3 % (ref 11.0–15.0)
Total Lymphocyte: 33 %
WBC: 5.7 10*3/uL (ref 3.8–10.8)

## 2020-04-14 LAB — COMPREHENSIVE METABOLIC PANEL
AG Ratio: 1.9 (calc) (ref 1.0–2.5)
ALT: 24 U/L (ref 6–29)
AST: 25 U/L (ref 10–35)
Albumin: 4.4 g/dL (ref 3.6–5.1)
Alkaline phosphatase (APISO): 69 U/L (ref 37–153)
BUN: 20 mg/dL (ref 7–25)
CO2: 25 mmol/L (ref 20–32)
Calcium: 9 mg/dL (ref 8.6–10.4)
Chloride: 102 mmol/L (ref 98–110)
Creat: 0.68 mg/dL (ref 0.50–1.05)
Globulin: 2.3 g/dL (calc) (ref 1.9–3.7)
Glucose, Bld: 90 mg/dL (ref 65–99)
Potassium: 4.7 mmol/L (ref 3.5–5.3)
Sodium: 139 mmol/L (ref 135–146)
Total Bilirubin: 0.2 mg/dL (ref 0.2–1.2)
Total Protein: 6.7 g/dL (ref 6.1–8.1)

## 2020-04-14 LAB — VITAMIN D 25 HYDROXY (VIT D DEFICIENCY, FRACTURES): Vit D, 25-Hydroxy: 32 ng/mL (ref 30–100)

## 2020-04-15 LAB — CYTOLOGY - PAP
Comment: NEGATIVE
Diagnosis: NEGATIVE
High risk HPV: NEGATIVE

## 2020-04-16 ENCOUNTER — Other Ambulatory Visit: Payer: Self-pay | Admitting: Nurse Practitioner

## 2020-04-20 ENCOUNTER — Other Ambulatory Visit: Payer: Self-pay | Admitting: Nurse Practitioner

## 2020-04-21 NOTE — Telephone Encounter (Signed)
Medication refill request: Prozac Last AEX:  04-13-20 TW Next AEX: not currently scheduled  Last MMG (if hormonal medication request): n/a  Refill authorized: Today, please advise.   Medication pended for #90, 0RF. Please refill if appropriate.

## 2020-05-18 ENCOUNTER — Ambulatory Visit: Payer: BC Managed Care – PPO | Admitting: Family Medicine

## 2020-05-18 ENCOUNTER — Other Ambulatory Visit: Payer: Self-pay

## 2020-05-18 ENCOUNTER — Encounter: Payer: Self-pay | Admitting: Family Medicine

## 2020-05-18 ENCOUNTER — Ambulatory Visit: Payer: Self-pay

## 2020-05-18 VITALS — BP 114/84 | Ht 67.0 in | Wt 196.0 lb

## 2020-05-18 DIAGNOSIS — M25561 Pain in right knee: Secondary | ICD-10-CM

## 2020-05-18 DIAGNOSIS — M25461 Effusion, right knee: Secondary | ICD-10-CM | POA: Diagnosis not present

## 2020-05-18 MED ORDER — PREDNISONE 5 MG PO TABS: ORAL_TABLET | ORAL | 0 refills | Status: DC

## 2020-05-18 NOTE — Assessment & Plan Note (Signed)
Moderate effusion on exam.  Has mild degenerative changes appreciated that could be exacerbated after her being at the beach. -Counseled on home exercise therapy and supportive care. -Prednisone. -Could consider injection or physical therapy.

## 2020-05-18 NOTE — Progress Notes (Signed)
  Kalynne Womac Therien - 55 y.o. female MRN [id number]  Date of birth: Dec 25, 1965  SUBJECTIVE:  Including CC & ROS.  No chief complaint on file.   Lindell J Blocher is a 55 y.o. female that is presenting with right knee pain.  The pain has been ongoing for the past 2 weeks.  Seem to be exacerbated after going to the beach.  Having some fullness and swelling.  No history of similar pain.  Has not tried any medications.   Review of Systems See HPI   HISTORY: Past Medical, Surgical, Social, and Family History Reviewed & Updated per EMR.   Pertinent Historical Findings include:  Past Medical History:  Diagnosis Date  . Acid reflux   . PMS (premenstrual syndrome)     Past Surgical History:  Procedure Laterality Date  . GASTROSTOMY TUBE PLACEMENT  9/04  . TONSILLECTOMY AND ADENOIDECTOMY     CHILD AGE    Family History  Problem Relation Age of Onset  . Hypertension Mother   . Lung cancer Mother        DIED IN 02-19-2022  . Diabetes Mother   . Diabetes Maternal Grandmother     Social History   Socioeconomic History  . Marital status: Married    Spouse name: Not on file  . Number of children: Not on file  . Years of education: Not on file  . Highest education level: Not on file  Occupational History  . Not on file  Tobacco Use  . Smoking status: Never Smoker  . Smokeless tobacco: Never Used  Vaping Use  . Vaping Use: Never used  Substance and Sexual Activity  . Alcohol use: Yes    Comment: occassionally  . Drug use: No  . Sexual activity: Yes    Partners: Male    Birth control/protection: Condom    Comment: first sexual intercourse at age 35, more than 5  Other Topics Concern  . Not on file  Social History Narrative   Married   Airline pilot   1 child: 14 yrs   4 pregnancies      First menstrual cycle: 9 yrs   Last menstrual cycle: 47 yrs      Social drinker: beer    Social Determinants of Corporate investment banker Strain: Not on file  Food Insecurity: Not on file   Transportation Needs: Not on file  Physical Activity: Not on file  Stress: Not on file  Social Connections: Not on file  Intimate Partner Violence: Not on file     PHYSICAL EXAM:  VS: BP 114/84 (BP Location: Left Arm, Patient Position: Sitting, Cuff Size: Large)   Ht 5\' 7"  (1.702 m)   Wt 196 lb (88.9 kg)   BMI 30.70 kg/m  Physical Exam Gen: NAD, alert, cooperative with exam, well-appearing MSK:  Right knee: Effusion noted. Normal range of motion. Normal strength resistance. Neurovascular intact  Limited ultrasound: Right knee:  Moderate effusion. Normal-appearing quadricep and patellar tendon. Mild medial joint space narrowing degenerative changes of the meniscus. Minor degenerative changes of the lateral meniscus  Summary: Medial joint space degenerative changes initiated  Ultrasound and interpretation by , MD    ASSESSMENT & PLAN:   Knee effusion, right Moderate effusion on exam.  Has mild degenerative changes appreciated that could be exacerbated after her being at the beach. -Counseled on home exercise therapy and supportive care. -Prednisone. -Could consider injection or physical therapy.

## 2020-05-18 NOTE — Patient Instructions (Signed)
Nice to meet you Please try the exercises  Please try ice   Please send me a message in MyChart with any questions or updates.  Please see me back in 4 weeks.   --Dr. Tyhir Schwan  

## 2020-06-11 ENCOUNTER — Other Ambulatory Visit: Payer: Self-pay

## 2020-06-15 ENCOUNTER — Encounter: Payer: Self-pay | Admitting: Family Medicine

## 2020-06-15 ENCOUNTER — Ambulatory Visit: Payer: Self-pay

## 2020-06-15 ENCOUNTER — Ambulatory Visit: Payer: BC Managed Care – PPO | Admitting: Family Medicine

## 2020-06-15 ENCOUNTER — Other Ambulatory Visit: Payer: Self-pay

## 2020-06-15 VITALS — BP 100/60 | Ht 67.0 in | Wt 196.0 lb

## 2020-06-15 DIAGNOSIS — M25461 Effusion, right knee: Secondary | ICD-10-CM

## 2020-06-15 MED ORDER — TRIAMCINOLONE ACETONIDE 40 MG/ML IJ SUSP
40.0000 mg | Freq: Once | INTRAMUSCULAR | Status: AC
  Administered 2020-06-15: 40 mg via INTRA_ARTICULAR

## 2020-06-15 MED ORDER — PENNSAID 2 % EX SOLN: 1.0000 "application " | Freq: Two times a day (BID) | CUTANEOUS | 2 refills | Status: DC

## 2020-06-15 NOTE — Assessment & Plan Note (Signed)
Having ongoing pain with limited improvement with prednisone.  Seems more associated with degenerative meniscal changes -Counseled on home exercise therapy and supportive care. -Aspiration injection today. -Synovial fluid analysis. -Pennsaid. -X-ray. -Could consider physical therapy.

## 2020-06-15 NOTE — Progress Notes (Signed)
Rebekah Aguilar - 55 y.o. female MRN [id number]  Date of birth: [birth year]  SUBJECTIVE:  Including CC & ROS.  No chief complaint on file.   Rebekah Aguilar is a 55 y.o. female that is presenting with worsening right knee pain.  Had no improvement with the prednisone.  Still having swelling and pain with going up and down stairs.   Review of Systems See HPI   HISTORY: Past Medical, Surgical, Social, and Family History Reviewed & Updated per EMR.   Pertinent Historical Findings include:  Past Medical History:  Diagnosis Date   Acid reflux    PMS (premenstrual syndrome)     Past Surgical History:  Procedure Laterality Date   GASTROSTOMY TUBE PLACEMENT  9/04   TONSILLECTOMY AND ADENOIDECTOMY     CHILD AGE    Family History  Problem Relation Age of Onset   Hypertension Mother    Lung cancer Mother        DIED IN 03/11/2022   Diabetes Mother    Diabetes Maternal Grandmother     Social History   Socioeconomic History   Marital status: Married    Spouse name: Not on file   Number of children: Not on file   Years of education: Not on file   Highest education level: Not on file  Occupational History   Not on file  Tobacco Use   Smoking status: Never   Smokeless tobacco: Never  Vaping Use   Vaping Use: Never used  Substance and Sexual Activity   Alcohol use: Yes    Comment: occassionally   Drug use: No   Sexual activity: Yes    Partners: Male    Birth control/protection: Condom    Comment: first sexual intercourse at age 56, more than 5  Other Topics Concern   Not on file  Social History Narrative   Married   Airline pilot   1 child: 14 yrs   4 pregnancies      First menstrual cycle: 9 yrs   Last menstrual cycle: 47 yrs      Social drinker: beer    Social Determinants of Corporate investment banker Strain: Not on file  Food Insecurity: Not on file  Transportation Needs: Not on file  Physical Activity: Not on file  Stress: Not on file  Social Connections:  Not on file  Intimate Partner Violence: Not on file     PHYSICAL EXAM:  VS: BP 100/60 (BP Location: Left Arm, Patient Position: Sitting, Cuff Size: Large)   Ht 5\' 7"  (1.702 m)   Wt 196 lb (88.9 kg)   BMI 30.70 kg/m  Physical Exam Gen: NAD, alert, cooperative with exam, well-appearing MSK:  Right knee: Ongoing effusion.  Normal range of motion. Normal strength resistance. Neurovascular intact   Aspiration/Injection Procedure Note Rebekah Aguilar Dec 27, 1965  Procedure: Aspiration and injection Indications: Right knee pain  Procedure Details Consent: Risks of procedure as well as the alternatives and risks of each were explained to the (patient/caregiver).  Consent for procedure obtained. Time Out: Verified patient identification, verified procedure, site/side was marked, verified correct patient position, special equipment/implants available, medications/allergies/relevent history reviewed, required imaging and test results available.  Performed.  The area was cleaned with iodine and alcohol swabs.    The right knee superior lateral suprapatellar pouch was injected using 3 cc of 1% lidocaine on a 25-gauge 1-1/2 inch needle.  An 18-gauge 1-1/2 inch needle was used to achieve aspiration.  The syringe was switched to mixture  containing 1 cc's of 40 mg Kenalog and 4 cc's of 0.25% bupivacaine was injected.  Ultrasound was used. Images were obtained in long views showing the injection.    Amount of Fluid Aspirated: 76mL Character of Fluid: clear and straw colored Fluid was sent TOI:ZTIW count and crystal identification  A sterile dressing was applied.  Patient did tolerate procedure well.    ASSESSMENT & PLAN:   Knee effusion, right Having ongoing pain with limited improvement with prednisone.  Seems more associated with degenerative meniscal changes -Counseled on home exercise therapy and supportive care. -Aspiration injection today. -Synovial fluid  analysis. -Pennsaid. -X-ray. -Could consider physical therapy.

## 2020-06-15 NOTE — Patient Instructions (Signed)
Good to see you Please try ice  Please try the rub on medicine  I will call with the xray results.   Please send me a message in MyChart with any questions or updates.  Please see me back in 4-6 weeks.   --Dr. Jordan Likes

## 2020-06-16 ENCOUNTER — Telehealth: Payer: Self-pay | Admitting: Family Medicine

## 2020-06-16 LAB — SYNOVIAL FLUID ANALYSIS, COMPLETE
Basophils, %: 0 %
Eosinophils-Synovial: 3 % — ABNORMAL HIGH (ref 0–2)
Lymphocytes-Synovial Fld: 60 % (ref 0–74)
Monocyte/Macrophage: 31 % (ref 0–69)
Neutrophil, Synovial: 6 % (ref 0–24)
Synoviocytes, %: 0 % (ref 0–15)
WBC, Synovial: 66 cells/uL (ref <150)

## 2020-06-16 NOTE — Telephone Encounter (Signed)
Informed of results.   Myra Rude, MD [facility] 06/16/2020, 12:03 PM

## 2020-07-30 ENCOUNTER — Ambulatory Visit: Payer: BC Managed Care – PPO | Admitting: Family Medicine

## 2020-07-30 ENCOUNTER — Other Ambulatory Visit: Payer: Self-pay

## 2020-07-30 DIAGNOSIS — M25461 Effusion, right knee: Secondary | ICD-10-CM

## 2020-07-30 NOTE — Assessment & Plan Note (Addendum)
Doing much better since that aspiration and injection. -Counseled on home exercise therapy and supportive care. -Could consider gel injection, prp or further imaging.

## 2020-07-30 NOTE — Progress Notes (Signed)
  Rebekah Aguilar - 55 y.o. female MRN [id number]  Date of birth: 1965/10/02  SUBJECTIVE:  Including CC & ROS.  No chief complaint on file.   Rebekah Aguilar is a 55 y.o. female that is following up for right knee pain.  She has been doing well since the injection.  She has minor pain.  She does have some swelling.   Review of Systems See HPI   HISTORY: Past Medical, Surgical, Social, and Family History Reviewed & Updated per EMR.   Pertinent Historical Findings include:  Past Medical History:  Diagnosis Date   Acid reflux    PMS (premenstrual syndrome)     Past Surgical History:  Procedure Laterality Date   GASTROSTOMY TUBE PLACEMENT  9/04   TONSILLECTOMY AND ADENOIDECTOMY     CHILD AGE    Family History  Problem Relation Age of Onset   Hypertension Mother    Lung cancer Mother        DIED IN 03-15-22   Diabetes Mother    Diabetes Maternal Grandmother     Social History   Socioeconomic History   Marital status: Married    Spouse name: Not on file   Number of children: Not on file   Years of education: Not on file   Highest education level: Not on file  Occupational History   Not on file  Tobacco Use   Smoking status: Never   Smokeless tobacco: Never  Vaping Use   Vaping Use: Never used  Substance and Sexual Activity   Alcohol use: Yes    Comment: occassionally   Drug use: No   Sexual activity: Yes    Partners: Male    Birth control/protection: Condom    Comment: first sexual intercourse at age 54, more than 5  Other Topics Concern   Not on file  Social History Narrative   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 Normal range of motion. No joint line tenderness. Neurovascular intact     ASSESSMENT & PLAN:   Knee effusion, right Doing much better since that aspiration and injection. -Counseled on home exercise therapy and supportive care. -Could consider gel injection, prp or further imaging.

## 2021-02-17 ENCOUNTER — Encounter: Payer: Self-pay | Admitting: Internal Medicine

## 2021-02-17 ENCOUNTER — Other Ambulatory Visit: Payer: Self-pay

## 2021-02-17 ENCOUNTER — Ambulatory Visit: Payer: BC Managed Care – PPO | Admitting: Internal Medicine

## 2021-02-17 VITALS — BP 120/88 | HR 75 | Ht 66.0 in | Wt 208.0 lb

## 2021-02-17 DIAGNOSIS — E063 Autoimmune thyroiditis: Secondary | ICD-10-CM

## 2021-02-17 DIAGNOSIS — E038 Other specified hypothyroidism: Secondary | ICD-10-CM | POA: Diagnosis not present

## 2021-02-17 LAB — TSH: TSH: 4.14 u[IU]/mL (ref 0.35–5.50)

## 2021-02-17 LAB — T4, FREE: Free T4: 0.88 ng/dL (ref 0.60–1.60)

## 2021-02-17 MED ORDER — LEVOTHYROXINE SODIUM 88 MCG PO TABS: 88.0000 ug | ORAL_TABLET | Freq: Every day | ORAL | 3 refills | Status: DC

## 2021-02-17 NOTE — Patient Instructions (Signed)
Please stop at the lab.  Please continue Levothyroxine 75 mcg daily.  Take the thyroid hormone every day, with water, at least 30 minutes before breakfast, separated by at least 4 hours from: - acid reflux medications - calcium - iron - multivitamins  Please come back for a follow-up appointment in 1 year. 

## 2021-02-17 NOTE — Progress Notes (Signed)
Patient ID: [name], female   DOB: 1965-07-07, 56 y.o.   MRN: [id number]  This visit occurred during the SARS-CoV-2 public health emergency.  Safety protocols were in place, including screening questions prior to the visit, additional usage of staff PPE, and extensive cleaning of exam room while observing appropriate contact time as indicated for disinfecting solutions.   HPI  Marwa J Bolte is a 56 y.o.-year-old female, initially referred by Maryelizabeth Rowan, NP, now returning for Hashimoto's hypothyroidism. Last visit 1 year ago.  Interim history: She is feeling well at this visit, w/o health problems since last visit.  Her only complaint is weight gain - per our scale, 17 pounds since last visit.  Reviewed history:  Pt. has been found to have a slightly high TSH in 09/2013, which, however, improved to normal in 2016 and 2017.  Her TPO antibodies were very high, confirming Hashimoto's thyroiditis. However, she had a clearly elevated TSH as checked in 01/2016 and she was referred back to endocrinology.   We started levothyroxine 25 mcg daily in 04/2016.  We subsequently increased the dose.  Pt is on levothyroxine 75 mcg daily, taken: - missed few doses  - in am - fasting - at least 30 min from b'fast and also coffee + cream - + Occasional calcium later in the day (Tums) - no iron - + multivitamins at lunchtime - + PPIs later in the day - not on Biotin  Reviewed patient's TFTs: Lab Results  Component Value Date   TSH 2.37 02/18/2020   TSH 3.12 02/15/2019   TSH 2.89 02/14/2018   TSH 2.77 02/14/2017   TSH 6.20 (H) 10/13/2016   TSH 4.44 08/12/2016   TSH 7.80 (H) 05/25/2016   TSH 11.43 (H) 04/13/2016   TSH 27.13 (H) 02/03/2016   TSH 3.936 01/22/2015   FREET4 0.93 02/18/2020   FREET4 0.95 02/15/2019   FREET4 1.02 02/14/2018   FREET4 0.94 02/14/2017   FREET4 0.89 10/13/2016   FREET4 0.89 08/12/2016   FREET4 0.79 05/25/2016   FREET4 0.76 04/13/2016   FREET4 0.8 02/03/2016    FREET4 0.79 10/11/2013   T3FREE 3.5 04/13/2016   T3FREE 3.0 10/11/2013   Her thyroid antibodies were elevated, indicating Hashimoto's thyroiditis, but much improved at last check: Component     Latest Ref Rng & Units 01/22/2015 02/03/2016 02/14/2018  Thyroperoxidase Ab SerPl-aCnc     <9 IU/mL 277 (H) >900 (H) 208 (H)  Thyroglobulin Ab     < or = 1 IU/mL 3 (H) 22 (H) 16 (H)   She was previously on selenium supplement, which we started to decrease her antithyroid antibodies >> stopped.   Pt denies: - feeling nodules in neck - hoarseness - dysphagia - choking - SOB with lying down  No FH of thyroid disease or thyroid cancer. No h/o radiation tx to head or neck. No herbal supplements. No Biotin use. No recent steroids use.   She also has a history of GERD and joint pains. In the past, on weight watchers, she lost 46 pounds in a year.  She gained some of this back afterwards.  ROS: + see HPI   I reviewed pt's medications, allergies, PMH, social hx, family hx, and changes were documented in the history of present illness. Otherwise, unchanged from my initial visit note.  Past Medical History:  Diagnosis Date   Acid reflux    PMS (premenstrual syndrome)    Past Surgical History:  Procedure Laterality Date   GASTROSTOMY TUBE PLACEMENT  9/04   TONSILLECTOMY AND ADENOIDECTOMY     CHILD AGE   History   Social History   Marital Status: Married    Spouse Name: N/A    Number of Children: 2   Occupational History    Accountant   Social History Main Topics   Smoking status: Never Smoker    Smokeless tobacco: Never Used   Alcohol Use: Yes     Comment: occassionally   Drug Use: No   Sexual Activity: Yes    Partners: Male    Pharmacist, hospital Protection: Condom    Social drinker: beer   Current Outpatient Medications on File Prior to Visit  Medication Sig Dispense Refill   clobetasol ointment (TEMOVATE) 0.05 % Apply daily x 4 weeks, then every other day x 4 weeks, then  twice weekly 30 g 2   Diclofenac Sodium (PENNSAID) 2 % SOLN Place 1 application onto the skin 2 (two) times daily. 112 g 2   FLUoxetine (PROZAC) 20 MG tablet TAKE 1 TABLET BY MOUTH EVERY DAY 90 tablet 3   levothyroxine (SYNTHROID) 75 MCG tablet Take 1 tablet (75 mcg total) by mouth daily before breakfast. 90 tablet 3   Loratadine (CLARITIN PO) as needed. ALLERGIES     Multiple Vitamins-Minerals (CENTRUM PO) daily.     Omeprazole (PRILOSEC PO) as needed. REFLUX     predniSONE (DELTASONE) 5 MG tablet Take 6 pills for first day, 5 pills second day, 4 pills third day, 3 pills fourth day, 2 pills the fifth day, and 1 pill sixth day. 21 tablet 0   No current facility-administered medications on file prior to visit.   No Known Allergies Family History  Problem Relation Age of Onset   Hypertension Mother    Lung cancer Mother        DIED IN February 23, 2022   Diabetes Mother    Diabetes Maternal Grandmother    PE: BP 120/88 (BP Location: Right Arm, Patient Position: Sitting, Cuff Size: Normal)    Pulse 75    Ht 5\' 6"  (1.676 m)    Wt 208 lb (94.3 kg)    SpO2 95%    BMI 33.57 kg/m  Wt Readings from Last 3 Encounters:  02/17/21 208 lb (94.3 kg)  07/30/20 195 lb (88.5 kg)  06/15/20 196 lb (88.9 kg)   Constitutional: overweight, in NAD Eyes: PERRLA, EOMI, no exophthalmos ENT: moist mucous membranes, + slight symmetric thyromegaly, no cervical lymphadenopathy Cardiovascular: RRR, No MRG Respiratory: CTA B Musculoskeletal: no deformities, strength intact in all 4 Skin: moist, warm, no rashes Neurological: no tremor with outstretched hands, DTR normal in all 4  ASSESSMENT: 1. Hashimoto's hypothyroidism  PLAN:  1. Patient with history of Hashimoto's thyroiditis, with development of hypothyroidism in 2018.  At that time, we started levothyroxine.  She was previously on selenium, now off, after her antithyroid antibodies decrease significantly. - latest thyroid labs reviewed with pt. >> normal: Lab  Results  Component Value Date   TSH 2.37 02/18/2020  - she continues on LT4 75 mcg daily - she will need a refill in 2 months - pt feels good on this dose. - we discussed about taking the thyroid hormone every day, with water, >30 minutes before breakfast, separated by >4 hours from acid reflux medications, calcium, iron, multivitamins. Pt. is taking it correctly. - will check thyroid tests today: TSH and fT4 - If labs are abnormal, she will need to return for repeat TFTs in 1.5 months - OTW, I will see  her back in a year  Component     Latest Ref Rng & Units 02/17/2021  TSH     0.35 - 5.50 uIU/mL 4.14  T4,Free(Direct)     0.60 - 1.60 ng/dL 0.63   Stage TSH is high in the normal range.  I will advise her to increase the levothyroxine dose to 88 mcg daily and come back for repeat TFTs in 5 to 6 weeks.  Carlus Pavlov, MD PhD [facility]

## 2021-04-16 ENCOUNTER — Other Ambulatory Visit: Payer: Self-pay | Admitting: Nurse Practitioner

## 2021-04-16 ENCOUNTER — Other Ambulatory Visit: Payer: Self-pay | Admitting: Internal Medicine

## 2021-04-20 ENCOUNTER — Ambulatory Visit (INDEPENDENT_AMBULATORY_CARE_PROVIDER_SITE_OTHER): Payer: BC Managed Care – PPO | Admitting: Nurse Practitioner

## 2021-04-20 ENCOUNTER — Encounter: Payer: Self-pay | Admitting: Nurse Practitioner

## 2021-04-20 VITALS — BP 120/80 | Ht 66.0 in | Wt 207.0 lb

## 2021-04-20 DIAGNOSIS — E559 Vitamin D deficiency, unspecified: Secondary | ICD-10-CM

## 2021-04-20 DIAGNOSIS — Z780 Asymptomatic menopausal state: Secondary | ICD-10-CM | POA: Diagnosis not present

## 2021-04-20 DIAGNOSIS — Z01419 Encounter for gynecological examination (general) (routine) without abnormal findings: Secondary | ICD-10-CM | POA: Diagnosis not present

## 2021-04-20 DIAGNOSIS — F418 Other specified anxiety disorders: Secondary | ICD-10-CM

## 2021-04-20 DIAGNOSIS — E785 Hyperlipidemia, unspecified: Secondary | ICD-10-CM

## 2021-04-20 DIAGNOSIS — N951 Menopausal and female climacteric states: Secondary | ICD-10-CM

## 2021-04-20 MED ORDER — FLUOXETINE HCL 20 MG PO TABS: 20.0000 mg | ORAL_TABLET | Freq: Every day | ORAL | 3 refills | Status: DC

## 2021-04-20 NOTE — Progress Notes (Signed)
? Rebekah Aguilar Oct 31, 1965 [id number] ? ? ?History:  56 y.o. [id number] presents for annual exam. Complains of pain with intercourse from dryness. Does not use lubrication. Postmenopausal - no HRT, no bleeding. Normal pap history. Hypothyroidism managed by endocrinology.  ? ?Gynecologic History ?No LMP recorded. Patient is postmenopausal. ?  ?Contraception/Family planning: post menopausal status ?Sexually active: Yes ? ?Health Maintenance ?Last Pap: 04/13/2020. Results were: Normal, 5-year repeat ?Last mammogram: 04/13/2020. Results were: Normal ?Last colonoscopy: Never ?Last Dexa: 01/24/2019. Results were: Normal ? ?Past medical history, past surgical history, family history and social history were all reviewed and documented in the EPIC chart. Married. Accountant, works remote. Daughter Bryson Ha doing well at [employer], graduates May 2023.  ? ?ROS:  A ROS was performed and pertinent positives and negatives are included. ? ?Exam: ? ?Vitals:  ? 04/20/21 0933  ?BP: 120/80  ?Weight: 207 lb (93.9 kg)  ?Height: 5\' 6"  (1.676 m)  ? ? ?Body mass index is 33.41 kg/m?. ? ?General appearance:  Normal ?Thyroid:  Symmetrical, normal in size, without palpable masses or nodularity. ?Respiratory ? Auscultation:  Clear without wheezing or rhonchi ?Cardiovascular ? Auscultation:  Regular rate, without rubs, murmurs or gallops ? Edema/varicosities:  Not grossly evident ?Abdominal ? Soft,nontender, without masses, guarding or rebound. ? Liver/spleen:  No organomegaly noted ? Hernia:  None appreciated ? Skin ? Inspection:  Grossly normal ?  ?Breasts: Examined lying and sitting.  ? Right: Without masses, retractions, discharge or axillary adenopathy. ? ? Left: Without masses, retractions, discharge or axillary adenopathy. ?Genitourinary  ? Inguinal/mons:  Normal without inguinal adenopathy ? External genitalia:  Mild redness/wrinkled appearance c/w LS.  ? BUS/Urethra/Skene's glands:  Normal ? Vagina:  Normal appearing with normal color and  discharge, no lesions ? Cervix:  Normal appearing without discharge or lesions ? Uterus:  Normal in size, shape and contour.  Midline and mobile, nontender ? Adnexa/parametria:   ?  Rt: Normal in size, without masses or tenderness. ?  Lt: Normal in size, without masses or tenderness. ? Anus and perineum: Normal ? Digital rectal exam: Normal sphincter tone without palpated masses or tenderness ? ?Patient informed chaperone available to be present for breast and pelvic exam. Patient has requested no chaperone to be present. Patient has been advised what will be completed during breast and pelvic exam.  ? ?Assessment/Plan:  55 y.o. [id number] for annual exam.  ? ?Well female exam with routine gynecological exam - Plan: CBC with Differential/Platelet, Comprehensive metabolic panel. Education provided on SBEs, importance of preventative screenings, current guidelines, high calcium diet, regular exercise, and multivitamin daily.  ? ?Postmenopausal - No HRT, no bleeding ? ?Vitamin D deficiency - Plan: VITAMIN D 25 Hydroxy (Vit-D Deficiency, Fractures) ? ?Hyperlipidemia, unspecified hyperlipidemia type - Plan: Lipid panel ? ?Menopausal vaginal dryness - Recommend coconut oil or olive oil for lubrication. If no improvement we did discuss option for vaginal estrogen. We also discussed vulvar inflammation that was also present last year and could be contributing to pain with intercourse. She has no pain otherwise. She still has clobetasol ointment at home and will see if this helps too. She was nervous about using last year.  ? ?Anxiety with depression - Plan: FLUoxetine (PROZAC) 20 MG tablet daily. Doing well on this. Refill x 1 year provided.  ? ?Screening for cervical cancer - Normal Pap history. Will repeat at 5-year interval per guidelines.  ? ?Screening for breast cancer -  Continue annual screenings. Normal breast exam today. ? ?Screening for colon cancer -  Has not had screening colonoscopy. We discussed current guidelines  and importance of preventative screenings. Information provided on [facility] and she plans to schedule this summer when husband if off.  ? ?Screening for osteoporosis - Normal bone density in 2021. Will repeat at 5-year interval per recommendation.  ? ?Return in 1 year for annual.  ? ? ? ? ? ? ? ?Tamela Gammon DNP, 10:01 AM 04/20/2021 ? ?

## 2021-04-21 LAB — COMPREHENSIVE METABOLIC PANEL
AG Ratio: 1.7 (calc) (ref 1.0–2.5)
ALT: 27 U/L (ref 6–29)
AST: 21 U/L (ref 10–35)
Albumin: 4.5 g/dL (ref 3.6–5.1)
Alkaline phosphatase (APISO): 63 U/L (ref 37–153)
BUN: 13 mg/dL (ref 7–25)
CO2: 27 mmol/L (ref 20–32)
Calcium: 9.6 mg/dL (ref 8.6–10.4)
Chloride: 104 mmol/L (ref 98–110)
Creat: 0.85 mg/dL (ref 0.50–1.03)
Globulin: 2.7 g/dL (calc) (ref 1.9–3.7)
Glucose, Bld: 92 mg/dL (ref 65–99)
Potassium: 4.5 mmol/L (ref 3.5–5.3)
Sodium: 140 mmol/L (ref 135–146)
Total Bilirubin: 0.5 mg/dL (ref 0.2–1.2)
Total Protein: 7.2 g/dL (ref 6.1–8.1)

## 2021-04-21 LAB — LIPID PANEL
Cholesterol: 230 mg/dL — ABNORMAL HIGH (ref <200)
HDL: 56 mg/dL (ref >=50)
LDL Cholesterol (Calc): 142 mg/dL (calc) — ABNORMAL HIGH
Non-HDL Cholesterol (Calc): 174 mg/dL (calc) — ABNORMAL HIGH (ref <130)
Total CHOL/HDL Ratio: 4.1 (calc) (ref <5.0)
Triglycerides: 180 mg/dL — ABNORMAL HIGH (ref <150)

## 2021-04-21 LAB — CBC WITH DIFFERENTIAL/PLATELET
Absolute Monocytes: 600 cells/uL (ref 200–950)
Basophils Absolute: 39 cells/uL (ref 0–200)
Basophils Relative: 0.7 %
Eosinophils Absolute: 220 cells/uL (ref 15–500)
Eosinophils Relative: 4 %
HCT: 40.7 % (ref 35.0–45.0)
Hemoglobin: 13.4 g/dL (ref 11.7–15.5)
Lymphs Abs: 1777 cells/uL (ref 850–3900)
MCH: 30.4 pg (ref 27.0–33.0)
MCHC: 32.9 g/dL (ref 32.0–36.0)
MCV: 92.3 fL (ref 80.0–100.0)
MPV: 10.3 fL (ref 7.5–12.5)
Monocytes Relative: 10.9 %
Neutro Abs: 2866 cells/uL (ref 1500–7800)
Neutrophils Relative %: 52.1 %
Platelets: 323 10*3/uL (ref 140–400)
RBC: 4.41 10*6/uL (ref 3.80–5.10)
RDW: 12.6 % (ref 11.0–15.0)
Total Lymphocyte: 32.3 %
WBC: 5.5 10*3/uL (ref 3.8–10.8)

## 2021-04-21 LAB — VITAMIN D 25 HYDROXY (VIT D DEFICIENCY, FRACTURES): Vit D, 25-Hydroxy: 30 ng/mL (ref 30–100)

## 2021-06-18 DIAGNOSIS — Z1231 Encounter for screening mammogram for malignant neoplasm of breast: Secondary | ICD-10-CM | POA: Diagnosis not present

## 2021-06-21 ENCOUNTER — Encounter: Payer: Self-pay | Admitting: Nurse Practitioner

## 2021-07-15 ENCOUNTER — Other Ambulatory Visit: Payer: Self-pay | Admitting: Internal Medicine

## 2021-08-07 ENCOUNTER — Other Ambulatory Visit: Payer: Self-pay | Admitting: Nurse Practitioner

## 2021-08-07 DIAGNOSIS — F418 Other specified anxiety disorders: Secondary | ICD-10-CM

## 2021-08-09 NOTE — Telephone Encounter (Signed)
Refill request received for Prozac 20 mg tab.   Rx sent on 04/20/21 #90/3RF  Call placed to [facility], spoke with Tryphena Stanley. Confirmed patient has refills on file. Additional RX not needed.   RX refused.   Routing to provider for final review.  Will close encounter.

## 2021-08-22 ENCOUNTER — Other Ambulatory Visit: Payer: Self-pay | Admitting: Internal Medicine

## 2021-10-10 ENCOUNTER — Other Ambulatory Visit: Payer: Self-pay | Admitting: Internal Medicine

## 2021-10-11 ENCOUNTER — Other Ambulatory Visit: Payer: Self-pay | Admitting: Nurse Practitioner

## 2021-10-11 DIAGNOSIS — F418 Other specified anxiety disorders: Secondary | ICD-10-CM

## 2022-02-08 ENCOUNTER — Other Ambulatory Visit: Payer: Self-pay | Admitting: Nurse Practitioner

## 2022-02-08 DIAGNOSIS — F418 Other specified anxiety disorders: Secondary | ICD-10-CM

## 2022-02-08 NOTE — Telephone Encounter (Signed)
AEX 04/20/21.

## 2022-02-17 ENCOUNTER — Encounter: Payer: Self-pay | Admitting: Internal Medicine

## 2022-02-17 ENCOUNTER — Ambulatory Visit: Payer: BC Managed Care – PPO | Admitting: Internal Medicine

## 2022-02-17 VITALS — BP 120/76 | HR 83 | Ht 66.0 in | Wt 216.6 lb

## 2022-02-17 DIAGNOSIS — E063 Autoimmune thyroiditis: Secondary | ICD-10-CM

## 2022-02-17 DIAGNOSIS — E038 Other specified hypothyroidism: Secondary | ICD-10-CM | POA: Diagnosis not present

## 2022-02-17 LAB — T4, FREE: Free T4: 0.81 ng/dL (ref 0.60–1.60)

## 2022-02-17 LAB — TSH: TSH: 2.82 u[IU]/mL (ref 0.35–5.50)

## 2022-02-17 MED ORDER — LEVOTHYROXINE SODIUM 88 MCG PO TABS: 88.0000 ug | ORAL_TABLET | Freq: Every day | ORAL | 3 refills | Status: DC

## 2022-02-17 NOTE — Progress Notes (Signed)
Patient ID: Rebekah Aguilar, female   DOB: 04-15-1965, 57 y.o.   MRN: [id number]  HPI  Rebekah Aguilar is a 57 y.o.-year-old female, initially referred by Rebekah Alas, NP, now returning for Hashimoto's hypothyroidism. Last visit 1 year ago.  Interim history: She is feeling well, without complaints other than weight gain.  She gained 8 pounds since last visit.  At last visit she had a 17 pound weight gain since the previous visit.  Reviewed history:  Pt. has been found to have a slightly high TSH in 09/2013, which, however, improved to normal in 2016 and 2017.  Her TPO antibodies were very high, confirming Hashimoto's thyroiditis. However, she had a clearly elevated TSH as checked in 01/2016 and she was referred back to endocrinology.   We started levothyroxine 25 mcg daily in 04/2016.  We subsequently increased the dose.  Pt is on levothyroxine 88 mcg daily (dose increased at last visit), taken: - missed few doses  - in am - fasting - at least 30 min from b'fast and also coffee + cream - + rarey calcium later in the day (Tums) - no iron - + multivitamins at lunchtime - + PPIs later in the day - not on Biotin  Reviewed patient's TFTs -she did not return for labs after the above dose increase: Lab Results  Component Value Date   TSH 4.14 02/17/2021   TSH 2.37 02/18/2020   TSH 3.12 02/15/2019   TSH 2.89 02/14/2018   TSH 2.77 02/14/2017   TSH 6.20 (H) 10/13/2016   TSH 4.44 08/12/2016   TSH 7.80 (H) 05/25/2016   TSH 11.43 (H) 04/13/2016   TSH 27.13 (H) 02/03/2016   FREET4 0.88 02/17/2021   FREET4 0.93 02/18/2020   FREET4 0.95 02/15/2019   FREET4 1.02 02/14/2018   FREET4 0.94 02/14/2017   FREET4 0.89 10/13/2016   FREET4 0.89 08/12/2016   FREET4 0.79 05/25/2016   FREET4 0.76 04/13/2016   FREET4 0.8 02/03/2016   T3FREE 3.5 04/13/2016   T3FREE 3.0 10/11/2013   Her thyroid antibodies were elevated, indicating Hashimoto's thyroiditis, but much improved at last  check: Component     Latest Ref Rng & Units 01/22/2015 02/03/2016 02/14/2018  Thyroperoxidase Ab SerPl-aCnc     <9 IU/mL 277 (H) >900 (H) 208 (H)  Thyroglobulin Ab     < or = 1 IU/mL 3 (H) 22 (H) 16 (H)   She was previously on selenium supplement, which we started to decrease her antithyroid antibodies >> stopped.   Pt denies: - feeling nodules in neck - hoarseness - dysphagia - choking  No FH of thyroid disease or thyroid cancer. No h/o radiation tx to head or neck. No herbal supplements. No Biotin use. No recent steroids use.   She also has a history of GERD and joint pains. In the past, on weight watchers, she lost 46 pounds in a year.  She gained some of this back afterwards.  ROS: + see HPI   I reviewed pt's medications, allergies, PMH, social hx, family hx, and changes were documented in the history of present illness. Otherwise, unchanged from my initial visit note.  Past Medical History:  Diagnosis Date   Acid reflux    PMS (premenstrual syndrome)    Past Surgical History:  Procedure Laterality Date   GASTROSTOMY TUBE PLACEMENT  9/04   TONSILLECTOMY AND ADENOIDECTOMY     CHILD AGE   History   Social History   Marital Status: Married    Spouse Name:  N/A    Number of Children: 2   Occupational History    Accountant   Social History Main Topics   Smoking status: Never Smoker    Smokeless tobacco: Never Used   Alcohol Use: Yes     Comment: occassionally   Drug Use: No   Sexual Activity: Yes    Partners: Male    Birth Control/ Protection: Condom    Social drinker: beer   Current Outpatient Medications on File Prior to Visit  Medication Sig Dispense Refill   Diclofenac Sodium (PENNSAID) 2 % SOLN Place 1 application onto the skin 2 (two) times daily. 112 g 2   FLUoxetine (PROZAC) 20 MG tablet TAKE 1 TABLET BY MOUTH EVERY DAY 90 tablet 0   levothyroxine (SYNTHROID) 88 MCG tablet TAKE 1 TABLET BY MOUTH EVERY DAY 90 tablet 1   Loratadine (CLARITIN PO) as  needed. ALLERGIES     Multiple Vitamins-Minerals (CENTRUM PO) daily.     Omeprazole (PRILOSEC PO) as needed. REFLUX     No current facility-administered medications on file prior to visit.   No Known Allergies Family History  Problem Relation Age of Onset   Hypertension Mother    Lung cancer Mother        DIED IN Mar 13, 2022   Diabetes Mother    Diabetes Maternal Grandmother    PE: BP 120/76 (BP Location: Left Arm, Patient Position: Sitting, Cuff Size: Normal)   Pulse 83   Ht 5' 6"$  (1.676 m)   Wt 216 lb 9.6 oz (98.2 kg)   SpO2 97%   BMI 34.96 kg/m  Wt Readings from Last 3 Encounters:  02/17/22 216 lb 9.6 oz (98.2 kg)  04/20/21 207 lb (93.9 kg)  02/17/21 208 lb (94.3 kg)   Constitutional: overweight, in NAD Eyes:  EOMI, no exophthalmos ENT: no neck masses, + slight symmetric thyromegaly, no cervical lymphadenopathy Cardiovascular: RRR, No MRG Respiratory: CTA B Musculoskeletal: no deformities Skin:no rashes Neurological: no tremor with outstretched hands  ASSESSMENT: 1. Hashimoto's hypothyroidism  PLAN:  1. Patient with history of Hashimoto's thyroiditis, with development of hypothyroidism in 2018.  At that time, we started levothyroxine.  She was previously on selenium, now off, after her antithyroid antibodies decreased significantly. - latest thyroid labs reviewed with pt. >> normal: Lab Results  Component Value Date   TSH 4.14 02/17/2021  - she continues on LT4 88 mcg daily, increased at last visit, after the above results returned.  She did not return for repeat labs, as advised... We discussed that every time we change the dose, we will need to have her back for labs in 1.5 months. - pt feels good on this dose except for weight gain. - we discussed about taking the thyroid hormone every day, with water, >30 minutes before breakfast, separated by >4 hours from acid reflux medications, calcium, iron, multivitamins. Pt. is taking it correctly. - will check thyroid tests  today: TSH and fT4 - If labs are abnormal, she will need to return for repeat TFTs in 1.5 months - OTW, I will see her in 1 year  Needs refills.  Component     Latest Ref Rng 02/17/2022  T4,Free(Direct)     0.60 - 1.60 ng/dL 0.81   TSH     0.35 - 5.50 uIU/mL 2.82   TFTs are normal.  Will refill the same dose of levothyroxine.  Philemon Kingdom, MD PhD [facility]

## 2022-02-17 NOTE — Patient Instructions (Signed)
Please stop at the lab.  Please continue Levothyroxine 88 mcg daily.  Take the thyroid hormone every day, with water, at least 30 minutes before breakfast, separated by at least 4 hours from: - acid reflux medications - calcium - iron - multivitamins  Please come back for a follow-up appointment in 1 year.

## 2022-04-18 ENCOUNTER — Encounter: Payer: Self-pay | Admitting: *Deleted

## 2022-05-14 ENCOUNTER — Other Ambulatory Visit: Payer: Self-pay | Admitting: Nurse Practitioner

## 2022-05-14 DIAGNOSIS — F418 Other specified anxiety disorders: Secondary | ICD-10-CM

## 2022-05-16 NOTE — Telephone Encounter (Signed)
Med refill request: Fluoxetine 20 mg tab PO daily Last AEX: 04/20/21 -TW Next AEX: 06/21/22 -TW Last MMG (if hormonal med) N/A  Rx pended #[id number] Refill authorized: Please Advise?

## 2022-06-21 ENCOUNTER — Ambulatory Visit (INDEPENDENT_AMBULATORY_CARE_PROVIDER_SITE_OTHER): Payer: BC Managed Care – PPO | Admitting: Nurse Practitioner

## 2022-06-21 ENCOUNTER — Encounter: Payer: Self-pay | Admitting: Nurse Practitioner

## 2022-06-21 VITALS — BP 118/80 | HR 82 | Ht 66.5 in | Wt 217.0 lb

## 2022-06-21 DIAGNOSIS — Z01419 Encounter for gynecological examination (general) (routine) without abnormal findings: Secondary | ICD-10-CM

## 2022-06-21 DIAGNOSIS — E785 Hyperlipidemia, unspecified: Secondary | ICD-10-CM

## 2022-06-21 DIAGNOSIS — N941 Unspecified dyspareunia: Secondary | ICD-10-CM | POA: Diagnosis not present

## 2022-06-21 DIAGNOSIS — F418 Other specified anxiety disorders: Secondary | ICD-10-CM

## 2022-06-21 DIAGNOSIS — N951 Menopausal and female climacteric states: Secondary | ICD-10-CM

## 2022-06-21 DIAGNOSIS — Z780 Asymptomatic menopausal state: Secondary | ICD-10-CM

## 2022-06-21 MED ORDER — FLUOXETINE HCL 20 MG PO TABS: 20.0000 mg | ORAL_TABLET | Freq: Every day | ORAL | 3 refills | Status: DC

## 2022-06-21 MED ORDER — ESTRADIOL 0.1 MG/GM VA CREA: 1.0000 g | TOPICAL_CREAM | VAGINAL | 1 refills | Status: DC

## 2022-06-21 NOTE — Patient Instructions (Signed)
Schedule Colonoscopy! ?[facility] ?[phone] ?[address] [place], [place] [address] ? ?

## 2022-06-21 NOTE — Progress Notes (Signed)
Rebekah Aguilar 01-18-1965 [id number]   History:  57 y.o. [id number] presents for annual exam. Pain with intercourse. Some improvement with olive oil but still very painful. Postmenopausal - no HRT, no bleeding. Normal pap history. Hypothyroidism managed by endocrinology. Anxiety and depression managed well with Prozac.   Gynecologic History No LMP recorded. Patient is postmenopausal.   Contraception/Family planning: post menopausal status Sexually active: Yes  Health Maintenance Last Pap: 04/13/2020. Results were: Normal neg HPV, 5-year repeat Last mammogram: 06/18/2021. Results were: Normal Last colonoscopy: Never Last Dexa: 01/24/2019. Results were: Normal  Past medical history, past surgical history, family history and social history were all reviewed and documented in the EPIC chart. Married. 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 Daughter [name] moved to [place].   ROS:  A ROS was performed and pertinent positives and negatives are included.  Exam:  Vitals:   06/21/22 1351  BP: 118/80  Pulse: 82  SpO2: 98%  Weight: 217 lb (98.4 kg)  Height: 5' 6.5" (1.689 m)     Body mass index is 34.5 kg/m.  General appearance:  Normal Thyroid:  Symmetrical, normal in size, without palpable masses or nodularity. Respiratory  Auscultation:  Clear without wheezing or rhonchi Cardiovascular  Auscultation:  Regular rate, without rubs, murmurs or gallops  Edema/varicosities:  Not grossly evident Abdominal  Soft,nontender, without masses, guarding or rebound.  Liver/spleen:  No organomegaly noted  Hernia:  None appreciated  Skin  Inspection:  Grossly normal   Breasts: Examined lying and sitting.   Right: Without masses, retractions, discharge or axillary adenopathy.   Left: Without masses, retractions, discharge or axillary adenopathy. Genitourinary   Inguinal/mons:  Normal without inguinal adenopathy  External genitalia:  Mild redness/wrinkled appearance c/w LS.    BUS/Urethra/Skene's glands:  Normal  Vagina:  Normal appearing with normal color and discharge, no lesions. Atrophic changes  Cervix:  Normal appearing without discharge or lesions  Uterus:  Normal in size, shape and contour.  Midline and mobile, nontender  Adnexa/parametria:     Rt: Normal in size, without masses or tenderness.   Lt: Normal in size, without masses or tenderness.  Anus and perineum: Normal  Digital rectal exam: Deferred  Patient informed chaperone available to be present for breast and pelvic exam. Patient has requested no chaperone to be present. Patient has been advised what will be completed during breast and pelvic exam.   Assessment/Plan:  57 y.o. [id number] for annual exam.   Well female exam with routine gynecological exam - Plan: CBC with Differential/Platelet, Comprehensive metabolic panel. Education provided on SBEs, importance of preventative screenings, current guidelines, high calcium diet, regular exercise, and multivitamin daily. Will return for fasting labs.   Postmenopausal - No HRT, no bleeding  Hyperlipidemia, unspecified hyperlipidemia type - Plan: Lipid panel  Menopausal vaginal dryness - Plan: estradiol (ESTRACE VAGINAL) 0.1 MG/GM vaginal cream twice weekly. Will use nightly x 2 weeks, then decrease to twice weekly. Coconut oil for lubricant.  Dyspareunia, female - Plan: estradiol (ESTRACE VAGINAL) 0.1 MG/GM vaginal cream twice weekly. Still very painful with lubricant.   Anxiety with depression - Plan: FLUoxetine (PROZAC) 20 MG tablet daily. Doing well on this. Refill x 1 year provided.   Screening for cervical cancer - Normal Pap history. Will repeat at 5-year interval per guidelines.   Screening for breast cancer -  Mammogram was scheduled today but when she arrived at [place] office was closed. Will reschedule. Continue annual screenings. Normal breast exam today.  Screening for colon cancer -  Has not had screening colonoscopy. We discussed  current guidelines and importance of preventative screenings. Information provided on [facility]. Also offered Cologuard.   Screening for osteoporosis - Normal bone density in 2021. Will repeat at 5-year interval per recommendation.   Return in 1 year for annual.         Olivia Mackie DNP, 2:17 PM 06/21/2022

## 2022-07-01 DIAGNOSIS — Z1231 Encounter for screening mammogram for malignant neoplasm of breast: Secondary | ICD-10-CM | POA: Diagnosis not present

## 2022-07-04 ENCOUNTER — Encounter: Payer: Self-pay | Admitting: Nurse Practitioner

## 2023-02-07 ENCOUNTER — Other Ambulatory Visit: Payer: Self-pay | Admitting: Internal Medicine

## 2023-02-20 ENCOUNTER — Ambulatory Visit: Payer: BC Managed Care – PPO | Admitting: Internal Medicine

## 2023-02-20 ENCOUNTER — Encounter: Payer: Self-pay | Admitting: Internal Medicine

## 2023-02-20 VITALS — BP 134/70 | HR 80 | Ht 66.5 in | Wt 222.6 lb

## 2023-02-20 DIAGNOSIS — E063 Autoimmune thyroiditis: Secondary | ICD-10-CM

## 2023-02-20 LAB — T4, FREE: Free T4: 1.1 ng/dL (ref 0.8–1.8)

## 2023-02-20 LAB — TSH: TSH: 3.34 m[IU]/L (ref 0.40–4.50)

## 2023-02-20 MED ORDER — LEVOTHYROXINE SODIUM 100 MCG PO TABS: 100.0000 ug | ORAL_TABLET | Freq: Every day | ORAL | 3 refills | Status: DC

## 2023-02-20 NOTE — Addendum Note (Signed)
 Addended by: Carlus Pavlov on: 02/20/2023 03:38 PM   Modules accepted: Orders

## 2023-02-20 NOTE — Progress Notes (Addendum)
 Patient ID: Rebekah Aguilar, female   DOB: 1965-06-14, 58 y.o.   MRN: [id number]  HPI  Rebekah Aguilar is a 58 y.o.-year-old female, initially referred by Maryelizabeth Rowan, NP, now returning for Hashimoto's hypothyroidism. Last visit 1 year ago.  Interim history: She is feeling well, without complaints.  Reviewed history:  Pt. has been found to have a slightly high TSH in 09/2013, which, however, improved to normal in 2016 and 2017.  Her TPO antibodies were very high, confirming Hashimoto's thyroiditis. However, she had a clearly elevated TSH as checked in 01/2016 and she was referred back to endocrinology.   We started levothyroxine 25 mcg daily in 04/2016.  We subsequently increased the dose.  Pt is on levothyroxine 88 mcg daily, taken: - no missed doses anymore (keeps it in the bathroom) - in am - fasting - at least 30 min from b'fast and also coffee + cream - + rarely calcium later in the day (Tums) - no iron - + multivitamins at lunchtime - + PPIs >4h later - not on Biotin  Reviewed patient's TFTs -she did not return for labs after the above dose increase: Lab Results  Component Value Date   TSH 2.82 02/17/2022   TSH 4.14 02/17/2021   TSH 2.37 02/18/2020   TSH 3.12 02/15/2019   TSH 2.89 02/14/2018   TSH 2.77 02/14/2017   TSH 6.20 (H) 10/13/2016   TSH 4.44 08/12/2016   TSH 7.80 (H) 05/25/2016   TSH 11.43 (H) 04/13/2016   FREET4 0.81 02/17/2022   FREET4 0.88 02/17/2021   FREET4 0.93 02/18/2020   FREET4 0.95 02/15/2019   FREET4 1.02 02/14/2018   FREET4 0.94 02/14/2017   FREET4 0.89 10/13/2016   FREET4 0.89 08/12/2016   FREET4 0.79 05/25/2016   FREET4 0.76 04/13/2016   T3FREE 3.5 04/13/2016   T3FREE 3.0 10/11/2013   Her thyroid antibodies were elevated, indicating Hashimoto's thyroiditis, but much improved at last check: Component     Latest Ref Rng & Units 01/22/2015 02/03/2016 02/14/2018  Thyroperoxidase Ab SerPl-aCnc     <9 IU/mL 277 (H) >900 (H) 208 (H)   Thyroglobulin Ab     < or = 1 IU/mL 3 (H) 22 (H) 16 (H)   She was previously on selenium supplement, which we started to decrease her antithyroid antibodies >> stopped.   Pt denies: - feeling nodules in neck - hoarseness - dysphagia - choking  No FH of thyroid disease or thyroid cancer. No h/o radiation tx to head or neck. No herbal supplements. No Biotin use. No recent steroids use.   She also has a history of GERD and joint pains. In the past, on weight watchers, she lost 46 pounds in a year.  She gained some of this back afterwards.  ROS: + see HPI   I reviewed pt's medications, allergies, PMH, social hx, family hx, and changes were documented in the history of present illness. Otherwise, unchanged from my initial visit note.  Past Medical History:  Diagnosis Date   Acid reflux    PMS (premenstrual syndrome)    Past Surgical History:  Procedure Laterality Date   GASTROSTOMY TUBE PLACEMENT  9/04   TONSILLECTOMY AND ADENOIDECTOMY     CHILD AGE   History   Social History   Marital Status: Married    Spouse Name: N/A    Number of Children: 2   Occupational History    Accountant   Social History Main Topics   Smoking status: Never Smoker  Smokeless tobacco: Never Used   Alcohol Use: Yes     Comment: occassionally   Drug Use: No   Sexual Activity: Yes    Partners: Male    Birth Control/ Protection: Condom    Social drinker: beer   Current Outpatient Medications on File Prior to Visit  Medication Sig Dispense Refill   estradiol (ESTRACE VAGINAL) 0.1 MG/GM vaginal cream Place 1 g vaginally 2 (two) times a week. Initial dose: nightly x 2 weeks, then twice weekly 42.5 g 1   FLUoxetine (PROZAC) 20 MG tablet Take 1 tablet (20 mg total) by mouth daily. 90 tablet 3   levothyroxine (SYNTHROID) 88 MCG tablet TAKE 1 TABLET BY MOUTH EVERY DAY 90 tablet 0   Loratadine (CLARITIN PO) as needed. ALLERGIES     Multiple Vitamins-Minerals (CENTRUM PO) daily.      Omeprazole (PRILOSEC PO) as needed. REFLUX     No current facility-administered medications on file prior to visit.   No Known Allergies Family History  Problem Relation Age of Onset   Hypertension Mother    Lung cancer Mother        DIED IN 15-Mar-2023   Diabetes Mother    Diabetes Maternal Grandmother    PE: BP 134/70   Pulse 80   Ht 5' 6.5" (1.689 m)   Wt 222 lb 9.6 oz (101 kg)   SpO2 96%   BMI 35.39 kg/m  Wt Readings from Last 3 Encounters:  02/20/23 222 lb 9.6 oz (101 kg)  06/21/22 217 lb (98.4 kg)  02/17/22 216 lb 9.6 oz (98.2 kg)   Constitutional: overweight, in NAD Eyes:  EOMI, no exophthalmos ENT: no neck masses, + slight symmetric thyromegaly, no cervical lymphadenopathy Cardiovascular: RRR, No MRG Respiratory: CTA B Musculoskeletal: no deformities Skin:no rashes Neurological: no tremor with outstretched hands  ASSESSMENT: 1. Hashimoto's hypothyroidism  PLAN:  1. Patient with history of Hashimoto's thyroiditis, with development of hypothyroidism in 2018.  We started levothyroxine at that time.  Previously on selenium, now off after her antithyroid antibodies decreased significantly. - latest thyroid labs reviewed with pt. >> normal: Lab Results  Component Value Date   TSH 2.82 02/17/2022  - she continues on LT4 88 mcg daily.  She does have a weight gain of 6 pounds since last visit but this may not be enough to increase her LT4 requirement. - pt feels good on this dose.  We discussed about hypothyroid symptoms and I did advise her to let me know if she develops these so we can check her TFTs earlier than next visit if needed. - we discussed about taking the thyroid hormone every day, with water, >30 minutes before breakfast, separated by >4 hours from acid reflux medications, calcium, iron, multivitamins. Pt. is taking it correctly. - will check thyroid tests today: TSH and fT4 - If labs are abnormal, she will need to return for repeat TFTs in 1.5 months - OTW, I  will see her back in a year  Orders Placed This Encounter  Procedures   TSH   T4, free   Needs refills.  Component     Latest Ref Rng 02/20/2023  TSH     0.40 - 4.50 mIU/L 3.34   T4,Free(Direct)     0.8 - 1.8 ng/dL 1.1   TSH is higher in the normal range, we can increase the dose of LT4 to 100 mcg daily and recheck her TFTs in 1.5 months.  Carlus Pavlov, MD PhD [facility]

## 2023-02-20 NOTE — Patient Instructions (Signed)
Please stop at the lab.  Please continue Levothyroxine 88 mcg daily.  Take the thyroid hormone every day, with water, at least 30 minutes before breakfast, separated by at least 4 hours from: - acid reflux medications - calcium - iron - multivitamins  Please come back for a follow-up appointment in 1 year. 

## 2023-08-03 ENCOUNTER — Other Ambulatory Visit: Payer: Self-pay | Admitting: Internal Medicine

## 2023-08-03 ENCOUNTER — Other Ambulatory Visit: Payer: Self-pay | Admitting: Nurse Practitioner

## 2023-08-03 DIAGNOSIS — F418 Other specified anxiety disorders: Secondary | ICD-10-CM

## 2023-08-03 NOTE — Telephone Encounter (Signed)
 Med refill request: Prozac   Last AEX: 06/21/22 Next AEX: not scheduled message sent to FD  Last MMG (if hormonal med) 07/01/22 Refill authorized: last rx 06/21/22 #90 with 3 refills. Please advise

## 2023-08-07 NOTE — Telephone Encounter (Signed)
 Patient notified, AEX scheduled for 10/13/23.

## 2023-10-13 ENCOUNTER — Encounter: Payer: Self-pay | Admitting: Nurse Practitioner

## 2023-10-13 ENCOUNTER — Ambulatory Visit (INDEPENDENT_AMBULATORY_CARE_PROVIDER_SITE_OTHER): Admitting: Nurse Practitioner

## 2023-10-13 VITALS — BP 118/76 | HR 87 | Ht 66.34 in | Wt 229.4 lb

## 2023-10-13 DIAGNOSIS — Z01419 Encounter for gynecological examination (general) (routine) without abnormal findings: Secondary | ICD-10-CM

## 2023-10-13 DIAGNOSIS — Z780 Asymptomatic menopausal state: Secondary | ICD-10-CM | POA: Diagnosis not present

## 2023-10-13 DIAGNOSIS — Z1331 Encounter for screening for depression: Secondary | ICD-10-CM

## 2023-10-13 DIAGNOSIS — E785 Hyperlipidemia, unspecified: Secondary | ICD-10-CM

## 2023-10-13 DIAGNOSIS — F418 Other specified anxiety disorders: Secondary | ICD-10-CM | POA: Diagnosis not present

## 2023-10-13 DIAGNOSIS — Z1211 Encounter for screening for malignant neoplasm of colon: Secondary | ICD-10-CM

## 2023-10-13 DIAGNOSIS — Z1231 Encounter for screening mammogram for malignant neoplasm of breast: Secondary | ICD-10-CM | POA: Diagnosis not present

## 2023-10-13 LAB — HM MAMMOGRAPHY

## 2023-10-13 MED ORDER — FLUOXETINE HCL 20 MG PO TABS: 20.0000 mg | ORAL_TABLET | Freq: Every day | ORAL | 3 refills | Status: AC

## 2023-10-13 NOTE — Progress Notes (Signed)
 Rebekah Aguilar July 13, 1965 [id number]   History:  58 y.o. [id number] presents for annual exam. Postmenopausal - no HRT, no bleeding. Normal pap history. Hypothyroidism managed by endocrinology. Anxiety managed well with Prozac .   Gynecologic History No LMP recorded. Patient is postmenopausal.   Contraception/Family planning: post menopausal status Sexually active: No  Health Maintenance Last Pap: 04/13/2020. Results were: Normal neg HPV Last mammogram: 07/01/2022. Results were: Normal Last colonoscopy: Never Last Dexa: 01/24/2019. Results were: Normal     10/13/2023   10:01 AM  Depression screen PHQ 2/9  Decreased Interest 0  Down, Depressed, Hopeless 0  PHQ - 2 Score 0     Past medical history, past surgical history, family history and social history were all reviewed and documented in the EPIC chart. Married. 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 Daughter Rebekah Aguilar moved to [place].   ROS:  A ROS was performed and pertinent positives and negatives are included.  Exam:  Vitals:   10/13/23 1001  BP: 118/76  Pulse: 87  SpO2: 97%  Weight: 229 lb 6.4 oz (104.1 kg)  Height: 5' 6.34 (1.685 m)      Body mass index is 36.65 kg/m.  General appearance:  Normal Thyroid :  Symmetrical, normal in size, without palpable masses or nodularity. Respiratory  Auscultation:  Clear without wheezing or rhonchi Cardiovascular  Auscultation:  Regular rate, without rubs, murmurs or gallops  Edema/varicosities:  Not grossly evident Abdominal  Soft,nontender, without masses, guarding or rebound.  Liver/spleen:  No organomegaly noted  Hernia:  None appreciated  Skin  Inspection:  Grossly normal   Breasts: Examined lying and sitting.   Right: Without masses, retractions, discharge or axillary adenopathy.   Left: Without masses, retractions, discharge or axillary adenopathy. Pelvic: External genitalia:  no lesions              Urethra:  normal appearing urethra with no masses,  tenderness or lesions              Bartholins and Skenes: normal                 Vagina: normal appearing vagina with normal color and discharge, no lesions. Atrophic changes              Cervix: no lesions Bimanual Exam:  Uterus:  no masses or tenderness              Adnexa: no mass, fullness, tenderness              Rectovaginal: Deferred              Anus:  normal, no lesions  Geni Pica, CMA present as chaperone.   Assessment/Plan:  58 y.o. [id number] for annual exam.   Well female exam with routine gynecological exam - Plan: CBC with Differential/Platelet, Comprehensive metabolic panel. Education provided on SBEs, importance of preventative screenings, current guidelines, high calcium diet, regular exercise, and multivitamin daily.   Postmenopausal - No HRT, no bleeding  Hyperlipidemia, unspecified hyperlipidemia type - Plan: Lipid panel  Anxiety with depression - Plan: FLUoxetine  (PROZAC ) 20 MG tablet daily. Doing well on this. Refill x 1 year provided.   Colon cancer screening - Plan: Ambulatory referral to Gastroenterology.Has not had screening. We discussed current guidelines and importance of preventative screenings. Also offered Cologuard.   Screening for cervical cancer - Normal Pap history. Will repeat at 5-year interval per guidelines.   Screening for breast cancer -  Continue annual screenings. Normal breast exam today. Scheduled  today.   Screening for osteoporosis - Normal bone density in 2021. Will repeat at 5-year interval per recommendation.   Return in about 1 year (around 10/12/2024) for Annual.         Rebekah DELENA Shutter DNP, 10:25 AM 10/13/2023

## 2023-10-14 LAB — CBC WITH DIFFERENTIAL/PLATELET
Absolute Lymphocytes: 2010 {cells}/uL (ref 850–3900)
Absolute Monocytes: 677 {cells}/uL (ref 200–950)
Basophils Absolute: 40 {cells}/uL (ref 0–200)
Basophils Relative: 0.6 %
Eosinophils Absolute: 107 {cells}/uL (ref 15–500)
Eosinophils Relative: 1.6 %
HCT: 39.7 % (ref 35.0–45.0)
Hemoglobin: 13.7 g/dL (ref 11.7–15.5)
MCH: 31.3 pg (ref 27.0–33.0)
MCHC: 34.5 g/dL (ref 32.0–36.0)
MCV: 90.6 fL (ref 80.0–100.0)
MPV: 10 fL (ref 7.5–12.5)
Monocytes Relative: 10.1 %
Neutro Abs: 3866 {cells}/uL (ref 1500–7800)
Neutrophils Relative %: 57.7 %
Platelets: 345 Thousand/uL (ref 140–400)
RBC: 4.38 Million/uL (ref 3.80–5.10)
RDW: 13 % (ref 11.0–15.0)
Total Lymphocyte: 30 %
WBC: 6.7 Thousand/uL (ref 3.8–10.8)

## 2023-10-14 LAB — COMPREHENSIVE METABOLIC PANEL WITH GFR
AG Ratio: 1.6 (calc) (ref 1.0–2.5)
ALT: 32 U/L — ABNORMAL HIGH (ref 6–29)
AST: 21 U/L (ref 10–35)
Albumin: 4.6 g/dL (ref 3.6–5.1)
Alkaline phosphatase (APISO): 64 U/L (ref 37–153)
BUN: 13 mg/dL (ref 7–25)
CO2: 30 mmol/L (ref 20–32)
Calcium: 9.6 mg/dL (ref 8.6–10.4)
Chloride: 101 mmol/L (ref 98–110)
Creat: 0.7 mg/dL (ref 0.50–1.05)
Globulin: 2.8 g/dL (ref 1.9–3.7)
Glucose, Bld: 94 mg/dL (ref 65–99)
Potassium: 4.5 mmol/L (ref 3.5–5.3)
Sodium: 138 mmol/L (ref 135–146)
Total Bilirubin: 0.5 mg/dL (ref 0.2–1.2)
Total Protein: 7.4 g/dL (ref 6.1–8.1)
eGFR: 99 mL/min/1.73m2 (ref >=60)

## 2023-10-14 LAB — LIPID PANEL
Cholesterol: 220 mg/dL — ABNORMAL HIGH (ref <200)
HDL: 47 mg/dL — ABNORMAL LOW (ref >=50)
LDL Cholesterol (Calc): 137 mg/dL — ABNORMAL HIGH
Non-HDL Cholesterol (Calc): 173 mg/dL — ABNORMAL HIGH (ref <130)
Total CHOL/HDL Ratio: 4.7 (calc) (ref <5.0)
Triglycerides: 221 mg/dL — ABNORMAL HIGH (ref <150)

## 2023-10-16 ENCOUNTER — Encounter: Payer: Self-pay | Admitting: Nurse Practitioner

## 2023-10-16 ENCOUNTER — Ambulatory Visit: Payer: Self-pay | Admitting: Nurse Practitioner

## 2023-11-01 ENCOUNTER — Other Ambulatory Visit: Payer: Self-pay | Admitting: Internal Medicine

## 2024-01-26 ENCOUNTER — Encounter: Payer: Self-pay | Admitting: Gastroenterology

## 2024-02-01 ENCOUNTER — Other Ambulatory Visit: Payer: Self-pay | Admitting: Internal Medicine

## 2024-02-01 DIAGNOSIS — E063 Autoimmune thyroiditis: Secondary | ICD-10-CM

## 2024-02-02 NOTE — Telephone Encounter (Signed)
 Patient is scheduled for 02/15/2024 for labs.

## 2024-02-15 ENCOUNTER — Other Ambulatory Visit

## 2024-02-20 ENCOUNTER — Ambulatory Visit: Payer: BC Managed Care – PPO | Admitting: Internal Medicine
